# Patient Record
Sex: Female | Born: 1965 | Race: White | Hispanic: No | Marital: Single | Smoking: Never smoker
Health system: Southern US, Community
[De-identification: ages and names within clinical notes are randomized; demographics above are authoritative.]

## PROBLEM LIST (undated history)

## (undated) DIAGNOSIS — K219 Gastro-esophageal reflux disease without esophagitis: Secondary | ICD-10-CM

## (undated) DIAGNOSIS — F32A Depression, unspecified: Secondary | ICD-10-CM

## (undated) DIAGNOSIS — D649 Anemia, unspecified: Secondary | ICD-10-CM

## (undated) DIAGNOSIS — I499 Cardiac arrhythmia, unspecified: Secondary | ICD-10-CM

## (undated) DIAGNOSIS — K649 Unspecified hemorrhoids: Secondary | ICD-10-CM

## (undated) DIAGNOSIS — Z5189 Encounter for other specified aftercare: Secondary | ICD-10-CM

## (undated) HISTORY — DX: Anemia, unspecified: D64.9

## (undated) HISTORY — DX: Depression, unspecified: F32.A

## (undated) HISTORY — DX: Unspecified hemorrhoids: K64.9

## (undated) HISTORY — DX: Gastro-esophageal reflux disease without esophagitis: K21.9

## (undated) HISTORY — DX: Encounter for other specified aftercare: Z51.89

---

## 1998-12-15 ENCOUNTER — Other Ambulatory Visit: Admission: RE | Admit: 1998-12-15 | Discharge: 1998-12-15 | Payer: Self-pay | Admitting: *Deleted

## 1999-06-26 ENCOUNTER — Encounter: Payer: Self-pay | Admitting: Obstetrics and Gynecology

## 1999-06-26 ENCOUNTER — Inpatient Hospital Stay (HOSPITAL_COMMUNITY): Admission: AD | Admit: 1999-06-26 | Discharge: 1999-06-26 | Payer: Self-pay | Admitting: Obstetrics and Gynecology

## 1999-10-03 ENCOUNTER — Encounter: Payer: Self-pay | Admitting: *Deleted

## 1999-10-03 ENCOUNTER — Inpatient Hospital Stay (HOSPITAL_COMMUNITY): Admission: AD | Admit: 1999-10-03 | Discharge: 1999-10-07 | Payer: Self-pay | Admitting: *Deleted

## 1999-10-19 ENCOUNTER — Encounter (HOSPITAL_COMMUNITY): Admission: RE | Admit: 1999-10-19 | Discharge: 1999-11-22 | Payer: Self-pay | Admitting: *Deleted

## 2000-06-20 ENCOUNTER — Ambulatory Visit (HOSPITAL_COMMUNITY): Admission: RE | Admit: 2000-06-20 | Discharge: 2000-06-20 | Payer: Self-pay | Admitting: Emergency Medicine

## 2000-06-20 ENCOUNTER — Emergency Department (HOSPITAL_COMMUNITY): Admission: EM | Admit: 2000-06-20 | Discharge: 2000-06-20 | Payer: Self-pay | Admitting: Emergency Medicine

## 2000-06-20 ENCOUNTER — Encounter: Payer: Self-pay | Admitting: Emergency Medicine

## 2001-04-29 ENCOUNTER — Other Ambulatory Visit: Admission: RE | Admit: 2001-04-29 | Discharge: 2001-04-29 | Payer: Self-pay | Admitting: Obstetrics and Gynecology

## 2002-06-17 ENCOUNTER — Other Ambulatory Visit: Admission: RE | Admit: 2002-06-17 | Discharge: 2002-06-17 | Payer: Self-pay | Admitting: Obstetrics and Gynecology

## 2003-05-11 ENCOUNTER — Other Ambulatory Visit: Admission: RE | Admit: 2003-05-11 | Discharge: 2003-05-11 | Payer: Self-pay | Admitting: Obstetrics and Gynecology

## 2003-11-30 ENCOUNTER — Other Ambulatory Visit: Admission: RE | Admit: 2003-11-30 | Discharge: 2003-11-30 | Payer: Self-pay | Admitting: Obstetrics and Gynecology

## 2004-08-16 ENCOUNTER — Other Ambulatory Visit: Admission: RE | Admit: 2004-08-16 | Discharge: 2004-08-16 | Payer: Self-pay | Admitting: Obstetrics and Gynecology

## 2012-02-05 ENCOUNTER — Other Ambulatory Visit: Payer: Self-pay | Admitting: Obstetrics and Gynecology

## 2012-02-05 DIAGNOSIS — D259 Leiomyoma of uterus, unspecified: Secondary | ICD-10-CM

## 2012-02-05 DIAGNOSIS — N920 Excessive and frequent menstruation with regular cycle: Secondary | ICD-10-CM

## 2012-02-07 ENCOUNTER — Ambulatory Visit
Admission: RE | Admit: 2012-02-07 | Discharge: 2012-02-07 | Disposition: A | Discharge status: Home / Self Care | Payer: Managed Care, Other (non HMO) | Source: Ambulatory Visit | Attending: Obstetrics and Gynecology | Admitting: Obstetrics and Gynecology

## 2012-02-07 VITALS — Ht 65.0 in | Wt 145.0 lb

## 2012-02-07 DIAGNOSIS — D259 Leiomyoma of uterus, unspecified: Secondary | ICD-10-CM

## 2012-02-13 ENCOUNTER — Other Ambulatory Visit: Payer: Managed Care, Other (non HMO)

## 2012-02-18 ENCOUNTER — Telehealth: Payer: Self-pay | Admitting: Emergency Medicine

## 2012-02-18 NOTE — Telephone Encounter (Signed)
ERROR

## 2012-02-21 ENCOUNTER — Other Ambulatory Visit: Payer: Managed Care, Other (non HMO)

## 2012-03-01 HISTORY — PX: UTERINE FIBROID SURGERY: SHX826

## 2012-03-05 ENCOUNTER — Ambulatory Visit
Admission: RE | Admit: 2012-03-05 | Discharge: 2012-03-05 | Disposition: A | Discharge status: Home / Self Care | Payer: Managed Care, Other (non HMO) | Source: Ambulatory Visit | Attending: Obstetrics and Gynecology | Admitting: Obstetrics and Gynecology

## 2012-03-05 DIAGNOSIS — D259 Leiomyoma of uterus, unspecified: Secondary | ICD-10-CM

## 2012-03-05 DIAGNOSIS — N920 Excessive and frequent menstruation with regular cycle: Secondary | ICD-10-CM

## 2012-03-05 MED ORDER — GADOBENATE DIMEGLUMINE 529 MG/ML IV SOLN
13.0000 mL | Freq: Once | INTRAVENOUS | Status: AC | PRN
  Administered 2012-03-05: 13 mL via INTRAVENOUS

## 2012-03-10 ENCOUNTER — Telehealth: Payer: Self-pay | Admitting: Emergency Medicine

## 2012-03-10 NOTE — Telephone Encounter (Signed)
CALLED PT TO EXPLAIN THAT THE MRI WAS GOOD FOR THE [place] AND SHE WOULD NEED AN EBX PRIOR TO PROCEDURE.  PT STATED" IM NOT INTERESTED IN PERUSING THE [place], AND I AM STILL CHECKING TO SEE WHAT MY OPTIONS ARE" I TOLD PT THAT SHE HAD NUMEROUS FIBROIDS ACCORDING TO THE MRI AND THEY WILL CONTINUE TO GROW IF SHE DOES NOT DO ANYTHING ABOUT IT.   WE WOULD BE HAPPY TO HAVE DR HOSS TO GO OVER THE IMAGES AND/OR TALK TO HER BY PHONE IF NEEDED.  SHE IS JUST GOING TO FOLLOW-UP WITH DR LOWE.   Jari Sportsman, EMT 03/10/2012 3:46 PM

## 2012-12-31 ENCOUNTER — Emergency Department (HOSPITAL_COMMUNITY)
Admission: EM | Admit: 2012-12-31 | Discharge: 2012-12-31 | Disposition: A | Discharge status: Home / Self Care | Payer: Managed Care, Other (non HMO) | Attending: Emergency Medicine | Admitting: Emergency Medicine

## 2012-12-31 ENCOUNTER — Encounter (HOSPITAL_COMMUNITY): Payer: Self-pay | Admitting: Emergency Medicine

## 2012-12-31 DIAGNOSIS — Z79899 Other long term (current) drug therapy: Secondary | ICD-10-CM | POA: Insufficient documentation

## 2012-12-31 DIAGNOSIS — R0602 Shortness of breath: Secondary | ICD-10-CM | POA: Insufficient documentation

## 2012-12-31 DIAGNOSIS — Z3202 Encounter for pregnancy test, result negative: Secondary | ICD-10-CM | POA: Insufficient documentation

## 2012-12-31 DIAGNOSIS — R000 Tachycardia, unspecified: Secondary | ICD-10-CM

## 2012-12-31 DIAGNOSIS — I498 Other specified cardiac arrhythmias: Secondary | ICD-10-CM | POA: Insufficient documentation

## 2012-12-31 HISTORY — DX: Cardiac arrhythmia, unspecified: I49.9

## 2012-12-31 LAB — BASIC METABOLIC PANEL
BUN: 15 mg/dL (ref 6–23)
CO2: 22 mEq/L (ref 19–32)
Creatinine, Ser: 0.75 mg/dL (ref 0.50–1.10)
GFR calc non Af Amer: >90 mL/min (ref >90)
Glucose, Bld: 95 mg/dL (ref 70–99)
Potassium: 4 mEq/L (ref 3.7–5.3)
Sodium: 137 mEq/L (ref 137–147)

## 2012-12-31 LAB — D-DIMER, QUANTITATIVE: D-Dimer, Quant: 0.42 ug/mL-FEU (ref 0.00–0.48)

## 2012-12-31 LAB — TSH: TSH: 1.975 u[IU]/mL (ref 0.350–4.500)

## 2012-12-31 LAB — CBC
HCT: 32.5 % — ABNORMAL LOW (ref 36.0–46.0)
Hemoglobin: 10 g/dL — ABNORMAL LOW (ref 12.0–15.0)
MCH: 24.2 pg — ABNORMAL LOW (ref 26.0–34.0)
MCHC: 30.8 g/dL (ref 30.0–36.0)
MCV: 78.5 fL (ref 78.0–100.0)
RBC: 4.14 MIL/uL (ref 3.87–5.11)

## 2012-12-31 LAB — PREGNANCY, URINE: Preg Test, Ur: NEGATIVE

## 2012-12-31 LAB — T4, FREE: Free T4: 1.11 ng/dL (ref 0.80–1.80)

## 2012-12-31 LAB — POCT I-STAT TROPONIN I

## 2012-12-31 MED ORDER — SODIUM CHLORIDE 0.9 % IV BOLUS (SEPSIS)
1000.0000 mL | Freq: Once | INTRAVENOUS | Status: AC
  Administered 2012-12-31: 1000 mL via INTRAVENOUS

## 2012-12-31 MED ORDER — LORAZEPAM 2 MG/ML IJ SOLN
0.5000 mg | Freq: Once | INTRAMUSCULAR | Status: AC
  Administered 2012-12-31: 0.5 mg via INTRAVENOUS
  Filled 2012-12-31: qty 1

## 2012-12-31 MED ORDER — METOPROLOL SUCCINATE ER 25 MG PO TB24: 25.0000 mg | ORAL_TABLET | Freq: Two times a day (BID) | ORAL | Status: DC

## 2012-12-31 MED ORDER — METOPROLOL TARTRATE 1 MG/ML IV SOLN
5.0000 mg | Freq: Once | INTRAVENOUS | Status: AC
  Administered 2012-12-31: 5 mg via INTRAVENOUS
  Filled 2012-12-31: qty 5

## 2012-12-31 NOTE — ED Notes (Signed)
Pt complains of an irregular heartrate since about 3am, she has hx of this and she was dx anemic

## 2012-12-31 NOTE — ED Provider Notes (Signed)
CSN: [id number]     Arrival date & time 12/31/12  0450 History   First MD Initiated Contact with Patient 12/31/12 [id number]     Chief Complaint  Patient presents with  . Irregular Heart Beat   (Consider location/radiation/quality/duration/timing/severity/associated sxs/prior Treatment) Patient is a 47 y.o. female presenting with palpitations. The history is provided by the patient. No language interpreter was used.  Palpitations Palpitations quality:  Regular Onset quality:  Gradual Associated symptoms: shortness of breath   Associated symptoms: no chest pain and no nausea   Associated symptoms comment:  She reports having a normal, asymptomatic day yesterday that ended around 2:30 a.m. She went to bed and felt her heart start racing. No chest pain or nausea. She got up to get some water and felt mildly short of breath. She has had similar symptoms in the past associated with profound anemia requiring transfusion.   Past Medical History  Diagnosis Date  . Irregular heart beat    History reviewed. No pertinent past surgical history. History reviewed. No pertinent family history. History  Substance Use Topics  . Smoking status: Never Smoker   . Smokeless tobacco: Not on file  . Alcohol Use: No   OB History   Grav Para Term Preterm Abortions TAB SAB Ect Mult Living                 Review of Systems  Constitutional: Negative for fever.  Respiratory: Positive for shortness of breath.   Cardiovascular: Positive for palpitations. Negative for chest pain.  Gastrointestinal: Negative for nausea and abdominal pain.  Genitourinary: Negative for dysuria, hematuria and vaginal bleeding.  Musculoskeletal: Negative for myalgias.    Allergies  Review of patient's allergies indicates no known allergies.  Home Medications   Current Outpatient Rx  Name  Route  Sig  Dispense  Refill  . IRON PO   Oral   Take 1 capsule by mouth daily.         . norethindrone-ethinyl estradiol (BALZIVA)  0.4-35 MG-MCG tablet   Oral   Take 1 tablet by mouth daily.          BP 148/78  Pulse 100  Temp(Src) 97.9 F (36.6 C) (Oral)  Resp 24  SpO2 98%  LMP 12/10/2012 Physical Exam  Constitutional: She is oriented to person, place, and time. She appears well-developed and well-nourished. No distress.  Eyes:  Mild conjunctival pallor.  Neck: Normal range of motion.  Cardiovascular: Normal rate.   No murmur heard. Pulmonary/Chest: Effort normal.  Abdominal: Soft. There is no tenderness.  Musculoskeletal: Normal range of motion.  Neurological: She is alert and oriented to person, place, and time.  Skin: Skin is warm and dry.  Psychiatric: She has a normal mood and affect.    ED Course  Procedures (including critical care time) Labs Review Labs Reviewed  CBC  BASIC METABOLIC PANEL  POCT I-STAT TROPONIN I   Imaging Review No results found.  EKG Interpretation    Date/Time:  Wednesday December 31 2012 05:04:06 EST Ventricular Rate:  109 PR Interval:  139 QRS Duration: 96 QT Interval:  326 QTC Calculation: 439 R Axis:   79 Text Interpretation:  Sinus tachycardia Nonspecific ST abnormality Abnormal ECG Confirmed by OPITZ  MD, BRIAN ([id number]) on 12/31/2012 5:07:34 AM            MDM  No diagnosis found. 1. Sinus tachycardia  Discussed with Dr. Dierdre Highman with labs pending. Plan: fluid bolus as hgb is stable and not thought to  be contributory. Continue to monitor.   Patient appears anxious on re-evaluation. She is increasingly concerned and feels "on edge" and then I get nauseous. NO vomiting.   Tachycardia persists despite IV fluid bolus, Ativan. Hgb 10.0 - doubt source of palpitations. Discussed with Dr. Fayrene Fearing and IV Lopressor ordered with 2nd liter fluid. Thyroid studies ordered for follow up evaluation. D-dimer and u-preg added. Will continue to observe.  Given Lopressor with decreased heart rate into the 90's. She reports feeling much better. Discussed with Dr.  Antoine Poche who recommended home beta blockers and office follow up. Appointment scheduled for patient on Jan. 14, 8:30 a.m. With D. Hilty.   Arnoldo Hooker, PA-C 12/31/12 1231

## 2012-12-31 NOTE — ED Notes (Signed)
Pt ambulates w/o assistance or difficulty.

## 2012-12-31 NOTE — ED Notes (Signed)
Pt c/o of heart racing since 3 am this morning. Pt. sts her heart monitor at home said her HR was 125. Pt has hx of anemia. Pt. Had an episode like this in March and her hemoglobin was 7. Pt was given blood. Pt c/o shortness of breath earlier, but is not feeling it so much now. Pt c/o generalized weakness. Pt A&Ox4. Denies chest pain, N/V.

## 2012-12-31 NOTE — Progress Notes (Signed)
   CARE MANAGEMENT ED NOTE 12/31/2012  Patient:  Julie Hanson, Julie Hanson   Account Number:  [id number]  Date Initiated:  12/31/2012  Documentation initiated by:  Edd Arbour  Subjective/Objective Assessment:   47 yr old female [employer] managed [place] without a pcp listed in EPIC Pt confirms no pcp but only an OB GYN as Dr Rana Snare [facility] ED HR 132 hgb 10.0     Subjective/Objective Assessment Detail:   c/o an irregular heart rate since about 3am, she has hx of this and she was dx anemic Had an episode like this in March and her hemoglobin was 7. Pt was given blood. Pt c/o shortness of breath earlier, but is not feeling it so much now. Pt c/o generalized weakness. Pt A&Ox4. Denies chest pain, N/V.     Action/Plan:   CM noted no pc spoke with pt and female at bedside answered questions about how to obtain pcp and CV (female stated RN/EDP recommended CV f/u)   Action/Plan Detail:   [facility] ED CM spoke with pt on how to obtain an in network pcp with insurance coverage via the customer service number or web site   Anticipated DC Date:  12/31/2012     Status Recommendation to Physician:   Result of Recommendation:    Other ED Services  Consult Working Plan    DC Planning Services  Other  PCP issues  Outpatient Services - Pt will follow up    Choice offered to / List presented to:            Status of service:  Completed, signed off  ED Comments:   ED Comments Detail:  M encouraged pt and discussed pt's responsibility to verify with pt's insurance carrier that any recommended medical provider offered by any emergency room or a hospital provider is within the carrier's network. The pt voiced understanding

## 2013-01-01 NOTE — ED Provider Notes (Signed)
Medical screening examination/treatment/procedure(s) were performed by non-physician practitioner and as supervising physician I was immediately available for consultation/collaboration.    Teressa Lower, MD 01/01/13 1001

## 2013-01-05 ENCOUNTER — Telehealth (HOSPITAL_COMMUNITY): Payer: Self-pay

## 2013-01-05 NOTE — ED Notes (Signed)
Pt call ing for Thyroid test results.  ID verified.  Pt informed of test results for both TSH and T4, free both WNL.

## 2013-01-08 ENCOUNTER — Ambulatory Visit (INDEPENDENT_AMBULATORY_CARE_PROVIDER_SITE_OTHER): Payer: Managed Care, Other (non HMO) | Admitting: Internal Medicine

## 2013-01-08 ENCOUNTER — Encounter: Payer: Self-pay | Admitting: Internal Medicine

## 2013-01-08 VITALS — BP 100/80 | HR 81 | Ht 65.0 in | Wt 143.0 lb

## 2013-01-08 DIAGNOSIS — R0609 Other forms of dyspnea: Secondary | ICD-10-CM

## 2013-01-08 DIAGNOSIS — R5383 Other fatigue: Secondary | ICD-10-CM | POA: Insufficient documentation

## 2013-01-08 DIAGNOSIS — R5381 Other malaise: Secondary | ICD-10-CM

## 2013-01-08 DIAGNOSIS — R000 Tachycardia, unspecified: Secondary | ICD-10-CM

## 2013-01-08 DIAGNOSIS — R0989 Other specified symptoms and signs involving the circulatory and respiratory systems: Secondary | ICD-10-CM

## 2013-01-08 DIAGNOSIS — I479 Paroxysmal tachycardia, unspecified: Secondary | ICD-10-CM | POA: Insufficient documentation

## 2013-01-08 NOTE — Patient Instructions (Signed)
Your physician has requested that you have an echocardiogram. Echocardiography is a painless test that uses sound waves to create images of your heart. It provides your doctor with information about the size and shape of your heart and how well your heart's chambers and valves are working. This procedure takes approximately one hour. There are no restrictions for this procedure.  Dr. Debara Pickett has ordered a MET Test.   Your physician recommends that you schedule a follow-up appointment in a few weeks, after your tests.

## 2013-01-08 NOTE — Progress Notes (Signed)
OFFICE NOTE  Chief Complaint:  Paroxysmal tachycardia, DOE, fatigue  Primary Care Physician: Luz Lex, MD  HPI:  Julie Hanson is a very pleasant 48 year old female who recently was seen in the emergency department for paroxysmal tachycardia. She describes an episode where she was walking up stairs late at night and felt increasing shortness of graft and rapid heart rate. She had to sit down and noted that her heart rate did not slow down. Several hours later she decided to present to the emergency department. On arrival she was noted to be in a sinus tachycardia. She was given IV fluids, anxiolytics and eventually low-dose beta blocker which resulted in a decrease in her heart rate. She then noted that she felt markedly better. Apparently Dr. Percival Spanish was counseled by phone who recommended starting metoprolol twice daily for her tachycardia. She was then discharged and follows up today. She reports that she has had some problems with tachycardia in the past, although this was about a year ago when she was profoundly anemic. This is secondary to dysfunctional uterine bleeding and ultimately was treated and has not recurred. She has kept a record of her heart rates on and Iphone over the past several years which didn't clearly show an elevation in her heart rate during her time of being anemic and several weeks prior to this episode her heart rate was starting to increase leading up to the event with heart rates into the 120s and 130s.  She denies any chest pain, but does feel a little more short of breath and has had fatigue, especially when her heart rate is elevated. There is a history of heart disease in her father who has high blood pressure and COPD is well, and her brother does have Duchenne's muscular dystrophy.  She denies any substance use or over-the-counter medications. She is a nonsmoker and only drinks one or 2 drinks a week.  PMHx:  Past Medical History  Diagnosis Date  .  Irregular heart beat     Past Surgical History  Procedure Laterality Date  . Uterine fibroid surgery  03/2012    Dr. Daron Offer    FAMHx:  No family history on file.  SOCHx:   reports that she has never smoked. She has never used smokeless tobacco. She reports that she drinks about 1.0 ounces of alcohol per week. Her drug history is not on file.  ALLERGIES:  No Known Allergies  ROS: A comprehensive review of systems was negative except for: Constitutional: positive for fatigue Respiratory: positive for dyspnea on exertion Cardiovascular: positive for palpitations and tachycardia  HOME MEDS: Current Outpatient Prescriptions  Medication Sig Dispense Refill  . IRON PO Take 1 capsule by mouth daily.      . metoprolol succinate (TOPROL XL) 25 MG 24 hr tablet Take 1 tablet (25 mg total) by mouth 2 (two) times daily.  60 tablet  0  . norethindrone-ethinyl estradiol (BALZIVA) 0.4-35 MG-MCG tablet Take 1 tablet by mouth daily.       No current facility-administered medications for this visit.    LABS/IMAGING: No results found for this or any previous visit (from the past 48 hour(s)). No results found.  VITALS: BP 100/80  Pulse 81  Ht 5' 5" (1.651 m)  Wt 143 lb (64.864 kg)  BMI 23.80 kg/m2  LMP 12/10/2012  EXAM: General appearance: alert and no distress, thin, well-dressed Neck: no carotid bruit and no JVD Lungs: clear to auscultation bilaterally Heart: regular rate and rhythm,  S1, S2 normal, no murmur, click, rub or gallop Abdomen: soft, non-tender; bowel sounds normal; no masses,  no organomegaly Extremities: extremities normal, atraumatic, no cyanosis or edema Pulses: 2+ and symmetric Skin: Skin color, texture, turgor normal. No rashes or lesions Neurologic: Grossly normal Psych: Normal, does not appear overly anxious  EKG: Normal sinus rhythm at 81, no evidence for preexcitation  ASSESSMENT: 1. Paroxysmal sinus tachycardia 2. Mild dyspnea on  exertion 3. Fatigue  PLAN: 1.   Julie Hanson had an unusual episode of increased heart rate which appears to be a sinus tachycardia by my review of the EKG in the emergency department. She did note an improvement in her heart rate after receiving IV Lopressor, however he did not abruptly decreased. I suspect this may be either an appropriate or inappropriate sinus tachycardia, possibly due to some underlying viral illness. She did report an unusual episode of diarrhea that occurred several days after starting her Lopressor, which may or may not be related. She has noted some increase in fatigue and shortness of breath with exertion and has limited her exercise because of this. I do think it would be reasonable to have her undergo an echocardiogram to make sure her systolic function is normal and that there are no abnormalities in chamber sizes that could be putting her at increased risk for arrhythmias. I did not hear any valvular heart murmurs today. In addition, we can further evaluate her shortness of breath with metabolic testing-I think she would be a good candidate for MET-Testing.  I recommended she continue her beta blocker and stay on it prior to her exercise stress testing. I will plan to see her back in the office in a few weeks to discuss results of the studies.  Pixie Casino, MD, [facility] Attending Cardiologist [facility]  Julie Hanson C 01/08/2013, 11:43 AM

## 2013-01-14 ENCOUNTER — Ambulatory Visit: Payer: Managed Care, Other (non HMO) | Admitting: Internal Medicine

## 2013-01-26 ENCOUNTER — Ambulatory Visit (HOSPITAL_COMMUNITY)
Admission: RE | Admit: 2013-01-26 | Discharge: 2013-01-26 | Disposition: A | Discharge status: Home / Self Care | Payer: Managed Care, Other (non HMO) | Source: Ambulatory Visit | Attending: Cardiovascular Disease | Admitting: Cardiovascular Disease

## 2013-01-26 DIAGNOSIS — R000 Tachycardia, unspecified: Secondary | ICD-10-CM | POA: Insufficient documentation

## 2013-01-26 DIAGNOSIS — R0609 Other forms of dyspnea: Secondary | ICD-10-CM | POA: Insufficient documentation

## 2013-01-26 DIAGNOSIS — R0989 Other specified symptoms and signs involving the circulatory and respiratory systems: Secondary | ICD-10-CM | POA: Insufficient documentation

## 2013-01-26 DIAGNOSIS — R0602 Shortness of breath: Secondary | ICD-10-CM

## 2013-01-26 NOTE — Progress Notes (Signed)
2D Echo Performed 01/26/2013    Julie Hanson, RCS

## 2013-01-29 ENCOUNTER — Encounter: Payer: Self-pay | Admitting: Internal Medicine

## 2013-01-29 ENCOUNTER — Ambulatory Visit (INDEPENDENT_AMBULATORY_CARE_PROVIDER_SITE_OTHER): Payer: Managed Care, Other (non HMO) | Admitting: Internal Medicine

## 2013-01-29 VITALS — BP 128/70 | HR 92 | Ht 67.0 in | Wt 146.4 lb

## 2013-01-29 DIAGNOSIS — R0989 Other specified symptoms and signs involving the circulatory and respiratory systems: Secondary | ICD-10-CM

## 2013-01-29 DIAGNOSIS — R5381 Other malaise: Secondary | ICD-10-CM

## 2013-01-29 DIAGNOSIS — R5383 Other fatigue: Secondary | ICD-10-CM

## 2013-01-29 DIAGNOSIS — I479 Paroxysmal tachycardia, unspecified: Secondary | ICD-10-CM

## 2013-01-29 DIAGNOSIS — R0609 Other forms of dyspnea: Secondary | ICD-10-CM

## 2013-01-29 MED ORDER — PROPRANOLOL HCL ER 60 MG PO CP24: 60.0000 mg | ORAL_CAPSULE | Freq: Every day | ORAL | Status: DC

## 2013-01-29 NOTE — Progress Notes (Signed)
OFFICE NOTE  Chief Complaint:  Paroxysmal tachycardia, DOE, fatigue  Primary Care Physician: Luz Lex, MD  HPI:  Julie Hanson is a very pleasant 48 year old female who recently was seen in the emergency department for paroxysmal tachycardia. She describes an episode where she was walking up stairs late at night and felt increasing shortness of graft and rapid heart rate. She had to sit down and noted that her heart rate did not slow down. Several hours later she decided to present to the emergency department. On arrival she was noted to be in a sinus tachycardia. She was given IV fluids, anxiolytics and eventually low-dose beta blocker which resulted in a decrease in her heart rate. She then noted that she felt markedly better. Apparently Dr. Percival Spanish was counseled by phone who recommended starting metoprolol twice daily for her tachycardia. She was then discharged and follows up today. She reports that she has had some problems with tachycardia in the past, although this was about a year ago when she was profoundly anemic. This is secondary to dysfunctional uterine bleeding and ultimately was treated and has not recurred. She has kept a record of her heart rates on and Iphone over the past several years which didn't clearly show an elevation in her heart rate during her time of being anemic and several weeks prior to this episode her heart rate was starting to increase leading up to the event with heart rates into the 120s and 130s.  She denies any chest pain, but does feel a little more short of breath and has had fatigue, especially when her heart rate is elevated. There is a history of heart disease in her father who has high blood pressure and COPD is well, and her brother does have Duchenne's muscular dystrophy.  She denies any substance use or over-the-counter medications. She is a nonsmoker and only drinks one or 2 drinks a week.  [name] returns today for followup of her metabolic  testing. She underwent CPET testing on 01/26/2013. Peak R. ER was 1.25. Peak the VO2 was 81% predicted. Peak heart rate 85% predicted. The VO2 and heart rate first heart essentially parallel with a slight flattening of the VO2 curve at anaerobic threshold. She did have high level exercise completing 14 minutes. The study was interpreted as low risk and was performed on beta blocker. She feels that the metoprolol is not necessarily helping her. Although I did mention that she would need to take that medicine to try to keep her from having paroxysmal tachycardia arrhythmias in the future. She also underwent an echocardiogram which was essentially normal, with an EF of 60-65% and no wall motion abnormalities. Diastolic function was normal.   PMHx:  Past Medical History  Diagnosis Date  . Irregular heart beat     Past Surgical History  Procedure Laterality Date  . Uterine fibroid surgery  03/2012    Dr. Daron Offer    FAMHx:  No family history on file.  SOCHx:   reports that she has never smoked. She has never used smokeless tobacco. She reports that she drinks about 1.0 ounces of alcohol per week. Her drug history is not on file.  ALLERGIES:  No Known Allergies  ROS: A comprehensive review of systems was negative except for: Constitutional: positive for fatigue Respiratory: positive for dyspnea on exertion Cardiovascular: positive for palpitations and tachycardia  HOME MEDS: Current Outpatient Prescriptions  Medication Sig Dispense Refill  . IRON PO Take 1 capsule by mouth  daily.      . norethindrone-ethinyl estradiol (BALZIVA) 0.4-35 MG-MCG tablet Take 1 tablet by mouth daily.      . propranolol ER (INDERAL LA) 60 MG 24 hr capsule Take 1 capsule (60 mg total) by mouth daily.  30 capsule  11   No current facility-administered medications for this visit.    LABS/IMAGING: No results found for this or any previous visit (from the past 48 hour(s)). No results found.  VITALS: BP 128/70   Pulse 92  Ht 5\' 7"  (1.702 m)  Wt 146 lb 6.4 oz (66.407 kg)  BMI 22.92 kg/m2  LMP 12/10/2012  EXAM: General appearance: alert and no distress, thin, well-dressed Neck: no carotid bruit and no JVD Lungs: clear to auscultation bilaterally Heart: regular rate and rhythm, S1, S2 normal, no murmur, click, rub or gallop Abdomen: soft, non-tender; bowel sounds normal; no masses,  no organomegaly Extremities: extremities normal, atraumatic, no cyanosis or edema Pulses: 2+ and symmetric Skin: Skin color, texture, turgor normal. No rashes or lesions Neurologic: Grossly normal Psych: Normal, does not appear overly anxious  EKG: Normal sinus rhythm at 81, no evidence for preexcitation  ASSESSMENT: 1. Paroxysmal sinus tachycardia 2. Mild dyspnea on exertion 3. Fatigue - low risk CPET test  PLAN: 1.   Ms. Drolet had a low risk CPET test with a reduced VO2 of 81%, but did have high level exercise. This suggest possible small vessel ischemia or an element of cardiac deconditioning. Heart rate may be playing a role in this as even on beta blocker, her heart rate was in the 90s at rest. She does not feel that the Toprol is that helpful for her. There is also significant anxiety and stress. I think she may benefit from an alternative beta blocker with some alpha properties. I recommended starting propranolol XR 60 mg daily. This will take the place of her metoprolol.  I also encourage you to work on exercise and stress management. I don't feel any additional testing is necessary at this time and will plan to see her back in 6 months or sooner as necessary.  Pixie Casino, MD, [facility] Attending Cardiologist [facility]  Kester Stimpson C 01/29/2013, 4:33 PM

## 2013-01-29 NOTE — Patient Instructions (Signed)
Your physician has recommended you make the following change in your medication: STOP Toprol. START Propranolol 60mg  once daily.  Your physician wants you to follow-up in: 6 months. You will receive a reminder letter in the mail two months in advance. If you don't receive a letter, please call our office to schedule the follow-up appointment.

## 2013-03-30 ENCOUNTER — Emergency Department (HOSPITAL_COMMUNITY)
Admission: EM | Admit: 2013-03-30 | Discharge: 2013-03-30 | Disposition: A | Discharge status: Home / Self Care | Payer: Managed Care, Other (non HMO) | Attending: Emergency Medicine | Admitting: Emergency Medicine

## 2013-03-30 ENCOUNTER — Emergency Department (HOSPITAL_COMMUNITY): Payer: Managed Care, Other (non HMO)

## 2013-03-30 ENCOUNTER — Encounter (HOSPITAL_COMMUNITY): Payer: Self-pay | Admitting: Emergency Medicine

## 2013-03-30 DIAGNOSIS — I499 Cardiac arrhythmia, unspecified: Secondary | ICD-10-CM | POA: Insufficient documentation

## 2013-03-30 DIAGNOSIS — R002 Palpitations: Secondary | ICD-10-CM | POA: Insufficient documentation

## 2013-03-30 DIAGNOSIS — Z79899 Other long term (current) drug therapy: Secondary | ICD-10-CM | POA: Insufficient documentation

## 2013-03-30 LAB — CBC
HCT: 34.6 % — ABNORMAL LOW (ref 36.0–46.0)
Hemoglobin: 11 g/dL — ABNORMAL LOW (ref 12.0–15.0)
MCH: 24.8 pg — AB (ref 26.0–34.0)
MCHC: 31.8 g/dL (ref 30.0–36.0)
MCV: 77.9 fL — AB (ref 78.0–100.0)
Platelets: 209 10*3/uL (ref 150–400)
RBC: 4.44 MIL/uL (ref 3.87–5.11)
RDW: 15.7 % — ABNORMAL HIGH (ref 11.5–15.5)
WBC: 6.3 10*3/uL (ref 4.0–10.5)

## 2013-03-30 LAB — BASIC METABOLIC PANEL
BUN: 19 mg/dL (ref 6–23)
CO2: 21 meq/L (ref 19–32)
CREATININE: 0.81 mg/dL (ref 0.50–1.10)
Calcium: 8.9 mg/dL (ref 8.4–10.5)
Chloride: 103 mEq/L (ref 96–112)
GFR calc Af Amer: >90 mL/min (ref >90)
GFR calc non Af Amer: 86 mL/min — ABNORMAL LOW (ref >90)
Glucose, Bld: 112 mg/dL — ABNORMAL HIGH (ref 70–99)
Potassium: 4.2 mEq/L (ref 3.7–5.3)
Sodium: 140 mEq/L (ref 137–147)

## 2013-03-30 LAB — TROPONIN I

## 2013-03-30 NOTE — Discharge Instructions (Signed)
Your testing has not shown a reason for your symptoms - please follow up with your family doctor and your cardiologist - if you continue to have symptoms of racing heart rate or chest pain, you should return to the ER for reevaluation immediately.  Please call your doctor for a followup appointment within 24-48 hours. When you talk to your doctor please let them know that you were seen in the emergency department and have them acquire all of your records so that they can discuss the findings with you and formulate a treatment plan to fully care for your new and ongoing problems.

## 2013-03-30 NOTE — ED Provider Notes (Signed)
CSN: [id number]     Arrival date & time 03/30/13  0403 History   First MD Initiated Contact with Patient 03/30/13 [phone]     Chief Complaint  Patient presents with  . Chest Pain     (Consider location/radiation/quality/duration/timing/severity/associated sxs/prior Treatment) HPI Comments: 48 year old female with a history of paroxysmal sinus tachycardia, history of anemia requiring blood transfusion in the past 6 months. She also has a history of self expressed anxiety. She describes an episode this evening of feeling like her heart rate was racing, she also felt chills and diaphoresis. These symptoms started spontaneously, at rest, while in bed. This is all essentially resolved at this time. Over the last week she has had a brief episode of chest pain, a brief episode of back pain. She is unable to explain these symptoms. At this time she has no chest pain or shortness of breath. Review of the medical records shows that she was recently seen by Dr. Imagene Riches in clinic and found to have ongoing problems with paroxysmal tachycardia for which he changed metoprolol 2 propanolol.  She denies fevers chills nausea vomiting diarrhea dysuria swelling or rashes  Patient is a 48 y.o. female presenting with chest pain. The history is provided by the patient and medical records.  Chest Pain   Past Medical History  Diagnosis Date  . Irregular heart beat    Past Surgical History  Procedure Laterality Date  . Uterine fibroid surgery  03/2012    Dr. Daron Offer   No family history on file. History  Substance Use Topics  . Smoking status: Never Smoker   . Smokeless tobacco: Never Used  . Alcohol Use: 1 - 1.5 oz/week    2-3 drink(s) per week   OB History   Grav Para Term Preterm Abortions TAB SAB Ect Mult Living                 Review of Systems  Cardiovascular: Positive for chest pain.  All other systems reviewed and are negative.      Allergies  Review of patient's allergies indicates no known  allergies.  Home Medications   Current Outpatient Rx  Name  Route  Sig  Dispense  Refill  . IRON PO   Oral   Take 1 capsule by mouth daily.         . norethindrone-ethinyl estradiol (BALZIVA) 0.4-35 MG-MCG tablet   Oral   Take 1 tablet by mouth daily.         . propranolol ER (INDERAL LA) 60 MG 24 hr capsule   Oral   Take 1 capsule (60 mg total) by mouth daily.   30 capsule   11    BP 107/76  Pulse 88  Temp(Src) 97.5 F (36.4 C) (Oral)  Resp 20  SpO2 100%  LMP 03/20/2013 Physical Exam  Nursing note and vitals reviewed. Constitutional: She appears well-developed and well-nourished. No distress.  HENT:  Head: Normocephalic and atraumatic.  Mouth/Throat: Oropharynx is clear and moist. No oropharyngeal exudate.  Eyes: Conjunctivae and EOM are normal. Pupils are equal, round, and reactive to light. Right eye exhibits no discharge. Left eye exhibits no discharge. No scleral icterus.  Neck: Normal range of motion. Neck supple. No JVD present. No thyromegaly present.  Cardiovascular: Normal rate, regular rhythm, normal heart sounds and intact distal pulses.  Exam reveals no gallop and no friction rub.   No murmur heard. Pulmonary/Chest: Effort normal and breath sounds normal. No respiratory distress. She has no wheezes. She  has no rales.  Abdominal: Soft. Bowel sounds are normal. She exhibits no distension and no mass. There is no tenderness.  Musculoskeletal: Normal range of motion. She exhibits no edema and no tenderness.  Lymphadenopathy:    She has no cervical adenopathy.  Neurological: She is alert. Coordination normal.  Skin: Skin is warm and dry. No rash noted. No erythema.  Psychiatric: She has a normal mood and affect. Her behavior is normal.    ED Course  Procedures (including critical care time) Labs Review Labs Reviewed  CBC - Abnormal; Notable for the following:    Hemoglobin 11.0 (*)    HCT 34.6 (*)    MCV 77.9 (*)    MCH 24.8 (*)    RDW 15.7 (*)     All other components within normal limits  BASIC METABOLIC PANEL - Abnormal; Notable for the following:    Glucose, Bld 112 (*)    GFR calc non Af Amer 86 (*)    All other components within normal limits  TROPONIN I   Imaging Review Dg Chest Port 1 View  03/30/2013   CLINICAL DATA:  Nausea, tachycardia, shortness of breath.  EXAM: PORTABLE CHEST - 1 VIEW  COMPARISON:  None available for comparison at time of study interpretation.  FINDINGS: The heart size and mediastinal contours are within normal limits. Both lungs are clear. The visualized skeletal structures are unremarkable.  IMPRESSION: No active disease.   Electronically Signed   By: Elon Alas   On: 03/30/2013 05:05     EKG Interpretation   Date/Time:  Monday March 30 2013 04:07:01 EDT Ventricular Rate:  88 PR Interval:  136 QRS Duration: 80 QT Interval:  348 QTC Calculation: 421 R Axis:   56 Text Interpretation:  Normal sinus rhythm Low voltage QRS ST \\T \ T wave  abnormality, consider lateral ischemia Abnormal ECG Since last tracing  rate slower Confirmed by Eevee Borbon  MD, Leslyn Monda ([id number]) on 03/30/2013 4:40:51 AM      MDM   Final diagnoses:  Palpitations    The patient appears well, she has normal heart and lung sounds, her EKG is unremarkable. Her recent stress test did not show any signs of significant coronary disease that would require intervention according to the cardiology reports however they were unsure exactly how her heart rate and stress test correlated with each other. At this time she has a normal troponin, EKG is essentially unchanged, she is not tachycardic. The patient appears overall stable. Will obtain a chest x-ray as well given her bizarre and vague symptoms  The patient's symptoms have totally gone away, labs are unremarkable and chest x-ray is unremarkable as well. Her hemoglobin continues to improve.  The patient was informed of these results and is stable for discharge to followup in the  outpatient setting. She is agreeable and expresses her understanding to the indications for return.    Johnna Acosta, MD 03/30/13 [phone]

## 2013-03-30 NOTE — ED Notes (Signed)
Wake up with hot and cold sweat, heart bburn/refulx and heart start racing, for the since early last week having tightness on her chest, right arm pain, lower left back pain at night. Some nauseated but never vomited, sated chronic SOB related to her hx anemia. Denies any chest pain now.stated slightly headache, no blurred vision.

## 2013-03-30 NOTE — ED Notes (Signed)
The pt woke up  approx one hour ago with chest tightness sob  Nausea.  Very jittery tachycardia tonight after she started thinking about the pain

## 2014-01-07 ENCOUNTER — Encounter: Payer: Self-pay | Admitting: Internal Medicine

## 2014-01-07 ENCOUNTER — Ambulatory Visit (INDEPENDENT_AMBULATORY_CARE_PROVIDER_SITE_OTHER): Payer: Federal, State, Local not specified - PPO | Admitting: Internal Medicine

## 2014-01-07 VITALS — BP 104/70 | HR 86 | Ht 66.0 in | Wt 149.6 lb

## 2014-01-07 DIAGNOSIS — R0609 Other forms of dyspnea: Secondary | ICD-10-CM

## 2014-01-07 DIAGNOSIS — I479 Paroxysmal tachycardia, unspecified: Secondary | ICD-10-CM

## 2014-01-07 DIAGNOSIS — R531 Weakness: Secondary | ICD-10-CM

## 2014-01-07 DIAGNOSIS — R5383 Other fatigue: Secondary | ICD-10-CM

## 2014-01-07 DIAGNOSIS — M79606 Pain in leg, unspecified: Secondary | ICD-10-CM

## 2014-01-07 MED ORDER — PROPRANOLOL HCL ER 60 MG PO CP24: 60.0000 mg | ORAL_CAPSULE | Freq: Every day | ORAL | Status: DC

## 2014-01-07 NOTE — Progress Notes (Signed)
OFFICE NOTE  Chief Complaint:  Leg pain with walking up hills  Primary Care Physician: Luz Lex, MD  HPI:  Julie Hanson is a very pleasant 49 year old female who recently was seen in the emergency department for paroxysmal tachycardia. She describes an episode where she was walking up stairs late at night and felt increasing shortness of graft and rapid heart rate. She had to sit down and noted that her heart rate did not slow down. Several hours later she decided to present to the emergency department. On arrival she was noted to be in a sinus tachycardia. She was given IV fluids, anxiolytics and eventually low-dose beta blocker which resulted in a decrease in her heart rate. She then noted that she felt markedly better. Apparently Dr. Percival Spanish was counseled by phone who recommended starting metoprolol twice daily for her tachycardia. She was then discharged and follows up today. She reports that she has had some problems with tachycardia in the past, although this was about a year ago when she was profoundly anemic. This is secondary to dysfunctional uterine bleeding and ultimately was treated and has not recurred. She has kept a record of her heart rates on and Iphone over the past several years which didn't clearly show an elevation in her heart rate during her time of being anemic and several weeks prior to this episode her heart rate was starting to increase leading up to the event with heart rates into the 120s and 130s.  She denies any chest pain, but does feel a little more short of breath and has had fatigue, especially when her heart rate is elevated. There is a history of heart disease in her father who has high blood pressure and COPD is well, and her brother does have Duchenne's muscular dystrophy.  She denies any substance use or over-the-counter medications. She is a nonsmoker and only drinks one or 2 drinks a week.  Kayleanna returns today for followup of her metabolic  testing. She underwent CPET testing on 01/26/2013. Peak R. ER was 1.25. Peak the VO2 was 81% predicted. Peak heart rate 85% predicted. The VO2 and heart rate first heart essentially parallel with a slight flattening of the VO2 curve at anaerobic threshold. She did have high level exercise completing 14 minutes. The study was interpreted as low risk and was performed on beta blocker. She feels that the metoprolol is not necessarily helping her. Although I did mention that she would need to take that medicine to try to keep her from having paroxysmal tachycardia arrhythmias in the future. She also underwent an echocardiogram which was essentially normal, with an EF of 60-65% and no wall motion abnormalities. Diastolic function was normal.  I saw Ms. Brener back in the office today. Her heart rate is much better controlled on propranolol. She is able to do exercise without any worsening shortness of breath. As mentioned above her CPAP indicates a normal VO2. She is continuing to have problems with bilateral calf and leg pain with exercise. Particular he would walking up hills or doing activities like going up stairs. She does not seem to have problems with a straight activities. She also notes that there is lobe or not along her spine in the lumbar region. She denies any low back pain.  PMHx:  Past Medical History  Diagnosis Date  . Irregular heart beat     Past Surgical History  Procedure Laterality Date  . Uterine fibroid surgery  03/2012  Dr. Keturah Barre. [place]    FAMHx:  No family history on file.  SOCHx:   reports that she has never smoked. She has never used smokeless tobacco. She reports that she drinks about 1.0 - 1.5 oz of alcohol per week. Her drug history is not on file.  ALLERGIES:  No Known Allergies  ROS: A comprehensive review of systems was negative except for: Musculoskeletal: positive for myalgias  HOME MEDS: Current Outpatient Prescriptions  Medication Sig Dispense Refill  .  IRON PO Take 1 capsule by mouth. Takes occasionally    . norethindrone-ethinyl estradiol (BALZIVA) 0.4-35 MG-MCG tablet Take 1 tablet by mouth daily.    . propranolol ER (INDERAL LA) 60 MG 24 hr capsule Take 1 capsule (60 mg total) by mouth daily. 30 capsule 11   No current facility-administered medications for this visit.    LABS/IMAGING: No results found for this or any previous visit (from the past 48 hour(s)). No results found.  VITALS: BP 104/70 mmHg  Pulse 86  Ht 5\' 6"  (1.676 m)  Wt 149 lb 9.6 oz (67.858 kg)  BMI 24.16 kg/m2  EXAM: General appearance: alert and no distress, thin, well-dressed Neck: no carotid bruit and no JVD Lungs: clear to auscultation bilaterally Heart: regular rate and rhythm, S1, S2 normal, no murmur, click, rub or gallop Abdomen: soft, non-tender; bowel sounds normal; no masses,  no organomegaly Extremities: extremities normal, atraumatic, no cyanosis or edema Pulses: 2+ and symmetric Skin: Skin color, texture, turgor normal. No rashes or lesions Neurologic: Grossly normal Psych: Normal, does not appear overly anxious  EKG: Normal sinus rhythm at 86  ASSESSMENT: 1. Paroxysmal sinus tachycardia - controlled on propranolol 2. Mild dyspnea on exertion 3. Fatigue - low risk CPET test, adequate VO2 4. Bilateral leg muscle pain with exercise  PLAN: 1.   Ms. Molner had a low risk CPET test with a mildly reduced VO2 of 81%, but did have high level exercise. This suggest possible small vessel ischemia or an element of cardiac deconditioning. Heart rate may be playing a role in this as even on beta blocker, her heart rate was in the 90s at rest. She has had some improvement in these symptoms on propranolol. She continues to have bilateral leg muscle cramping and soreness which limits her activity. This seems to start in the calves and progressed to the thighs bilaterally. She pointed out a not along her spine today, which felt firm and somewhat abnormal.  She may need further imaging of this. I recommended she see an orthopedic spine specialist for further evaluation of her leg pain which could be related to low back disorder such as spinal stenosis.  Pixie Casino, MD, [facility] Attending Cardiologist [facility]  Emmely Bittinger C 01/07/2014, 2:30 PM

## 2014-01-07 NOTE — Patient Instructions (Signed)
Your physician wants you to follow-up in: 1 year. You will receive a reminder letter in the mail two months in advance. If you don't receive a letter, please call our office to schedule the follow-up appointment.  You have been referred to Dr. Hyman Bower [facility]

## 2015-01-07 ENCOUNTER — Telehealth: Payer: Self-pay | Admitting: Internal Medicine

## 2015-01-07 NOTE — Telephone Encounter (Signed)
Close encounter 

## 2015-01-10 ENCOUNTER — Ambulatory Visit: Payer: BLUE CROSS/BLUE SHIELD | Admitting: Internal Medicine

## 2015-01-11 ENCOUNTER — Ambulatory Visit: Payer: BLUE CROSS/BLUE SHIELD | Admitting: Internal Medicine

## 2015-01-12 ENCOUNTER — Encounter: Payer: Self-pay | Admitting: Internal Medicine

## 2015-01-12 ENCOUNTER — Ambulatory Visit (INDEPENDENT_AMBULATORY_CARE_PROVIDER_SITE_OTHER): Payer: BLUE CROSS/BLUE SHIELD | Admitting: Internal Medicine

## 2015-01-12 VITALS — BP 122/78 | HR 87 | Ht 66.0 in | Wt 139.7 lb

## 2015-01-12 DIAGNOSIS — I479 Paroxysmal tachycardia, unspecified: Secondary | ICD-10-CM

## 2015-01-12 DIAGNOSIS — M79606 Pain in leg, unspecified: Secondary | ICD-10-CM | POA: Diagnosis not present

## 2015-01-12 NOTE — Progress Notes (Signed)
OFFICE NOTE  Chief Complaint:  No complaints  Primary Care Physician: Luz Lex, MD  HPI:  Julie Hanson is a very pleasant 50 year old female who recently was seen in the emergency department for paroxysmal tachycardia. She describes an episode where she was walking up stairs late at night and felt increasing shortness of graft and rapid heart rate. She had to sit down and noted that her heart rate did not slow down. Several hours later she decided to present to the emergency department. On arrival she was noted to be in a sinus tachycardia. She was given IV fluids, anxiolytics and eventually low-dose beta blocker which resulted in a decrease in her heart rate. She then noted that she felt markedly better. Apparently Dr. Percival Spanish was counseled by phone who recommended starting metoprolol twice daily for her tachycardia. She was then discharged and follows up today. She reports that she has had some problems with tachycardia in the past, although this was about a year ago when she was profoundly anemic. This is secondary to dysfunctional uterine bleeding and ultimately was treated and has not recurred. She has kept a record of her heart rates on and Iphone over the past several years which didn't clearly show an elevation in her heart rate during her time of being anemic and several weeks prior to this episode her heart rate was starting to increase leading up to the event with heart rates into the 120s and 130s.  She denies any chest pain, but does feel a little more short of breath and has had fatigue, especially when her heart rate is elevated. There is a history of heart disease in her father who has high blood pressure and COPD is well, and her brother does have Duchenne's muscular dystrophy.  She denies any substance use or over-the-counter medications. She is a nonsmoker and only drinks one or 2 drinks a week.  Julie Hanson returns today for followup of her metabolic testing. She underwent  CPET testing on 01/26/2013. Peak R. ER was 1.25. Peak the VO2 was 81% predicted. Peak heart rate 85% predicted. The VO2 and heart rate first heart essentially parallel with a slight flattening of the VO2 curve at anaerobic threshold. She did have high level exercise completing 14 minutes. The study was interpreted as low risk and was performed on beta blocker. She feels that the metoprolol is not necessarily helping her. Although I did mention that she would need to take that medicine to try to keep her from having paroxysmal tachycardia arrhythmias in the future. She also underwent an echocardiogram which was essentially normal, with an EF of 60-65% and no wall motion abnormalities. Diastolic function was normal.  I saw Julie Hanson back in the office today. Her heart rate is much better controlled on propranolol. She is able to do exercise without any worsening shortness of breath. As mentioned above her CPAP indicates a normal VO2. She is continuing to have problems with bilateral calf and leg pain with exercise. Particular he would walking up hills or doing activities like going up stairs. She does not seem to have problems with a straight activities. She also notes that there is lobe or not along her spine in the lumbar region. She denies any low back pain.  Julie Hanson returns today for follow-up. She tells me that she developed worsening and progressive anemia which is likely secondary to bleeding from fibroids. At one point her H&H was as low as 7 and 21. She was  also noted to be tachycardic, not surprisingly at this time. While she was on a beta blocker this may have contributed to worsening fatigue. I had recommended discontinuing her propranolol at her last office visit she mentioned that almost immediately her symptoms improved. Subsequently she started on iron is had some improvement in her anemia and the discomfort in her legs with exercise is now gone.  PMHx:  Past Medical History  Diagnosis  Date  . Irregular heart beat     Past Surgical History  Procedure Laterality Date  . Uterine fibroid surgery  03/2012    Dr. Daron Offer    FAMHx:  No family history on file.  SOCHx:   reports that she has never smoked. She has never used smokeless tobacco. She reports that she drinks about 1.0 - 1.5 oz of alcohol per week. Her drug history is not on file.  ALLERGIES:  No Known Allergies  ROS: A comprehensive review of systems was negative.  HOME MEDS: Current Outpatient Prescriptions  Medication Sig Dispense Refill  . IRON PO Take 1 capsule by mouth. Takes occasionally    . norethindrone-ethinyl estradiol (BALZIVA) 0.4-35 MG-MCG tablet Take 1 tablet by mouth daily.     No current facility-administered medications for this visit.    LABS/IMAGING: No results found for this or any previous visit (from the past 48 hour(s)). No results found.  VITALS: BP 122/78 mmHg  Pulse 87  Ht 5\' 6"  (1.676 m)  Wt 139 lb 11.2 oz (63.368 kg)  BMI 22.56 kg/m2  EXAM: Deferred  EKG: Normal sinus rhythm at 87  ASSESSMENT: 1. Paroxysmal sinus tachycardia - resolved  PLAN: 1.   Julie Hanson has had complete resolution of her symptoms after treatment of anemia. She was further identified to have uterine fibroids as the source of her bleeding. She is now off of propranolol and based on her testing it is likely not necessary for her. She did have normal LV function with normal diastolic function on echo and a reasonable cardio metabolic test. I'm pleased that her symptoms have improved significantly and feel that she could follow-up with me only on an as-needed basis.  Pixie Casino, MD, [facility] Attending Cardiologist [place] C Elzina Devera 01/12/2015, 5:12 PM

## 2015-01-12 NOTE — Patient Instructions (Signed)
Dr Hilty recommends that you follow-up with him as needed. 

## 2015-04-01 ENCOUNTER — Emergency Department (INDEPENDENT_AMBULATORY_CARE_PROVIDER_SITE_OTHER)
Admission: EM | Admit: 2015-04-01 | Discharge: 2015-04-01 | Disposition: A | Discharge status: Home / Self Care | Payer: BLUE CROSS/BLUE SHIELD | Source: Home / Self Care | Attending: Family Medicine | Admitting: Family Medicine

## 2015-04-01 ENCOUNTER — Encounter: Payer: Self-pay | Admitting: *Deleted

## 2015-04-01 DIAGNOSIS — J069 Acute upper respiratory infection, unspecified: Secondary | ICD-10-CM

## 2015-04-01 DIAGNOSIS — J029 Acute pharyngitis, unspecified: Secondary | ICD-10-CM | POA: Diagnosis not present

## 2015-04-01 DIAGNOSIS — B9789 Other viral agents as the cause of diseases classified elsewhere: Secondary | ICD-10-CM

## 2015-04-01 LAB — POCT RAPID STREP A (OFFICE): RAPID STREP A SCREEN: NEGATIVE

## 2015-04-01 MED ORDER — BENZONATATE 200 MG PO CAPS: 200.0000 mg | ORAL_CAPSULE | Freq: Every day | ORAL | Status: DC

## 2015-04-01 NOTE — ED Provider Notes (Signed)
CSN: [id number]     Arrival date & time 04/01/15  1933 History   None    Chief Complaint  Patient presents with  . Sore Throat      HPI Comments: Patient complains of three day history of typical cold-like symptoms developing over several days,  including mild sore throat, sinus congestion, fatigue, and cough.   The history is provided by the patient.    Past Medical History  Diagnosis Date  . Irregular heart beat    Past Surgical History  Procedure Laterality Date  . Uterine fibroid surgery  03/2012    Dr. Daron Offer   History reviewed. No pertinent family history. Social History  Substance Use Topics  . Smoking status: Never Smoker   . Smokeless tobacco: Never Used  . Alcohol Use: 1.0 - 1.5 oz/week    2-3 drink(s) per week   OB History    No data available     Review of Systems + sore throat + hoarse + cough No pleuritic pain No wheezing ? nasal congestion + post-nasal drainage No sinus pain/pressure No itchy/red eyes No earache No hemoptysis No SOB No fever/chills No nausea No vomiting No abdominal pain No diarrhea No urinary symptoms No skin rash + fatigue No myalgias No headache Used OTC meds without relief  Allergies  Review of patient's allergies indicates no known allergies.  Home Medications   Prior to Admission medications   Medication Sig Start Date End Date Taking? Authorizing Provider  benzonatate (TESSALON) 200 MG capsule Take 1 capsule (200 mg total) by mouth at bedtime. Take as needed for cough 04/01/15   Kandra Nicolas, MD  IRON PO Take 1 capsule by mouth. Takes occasionally    Historical Provider, MD  norethindrone-ethinyl estradiol (BALZIVA) 0.4-35 MG-MCG tablet Take 1 tablet by mouth daily.    Historical Provider, MD   Meds Ordered and Administered this Visit  Medications - No data to display  BP 125/82 mmHg  Pulse 95  Temp(Src) 98.2 F (36.8 C) (Oral)  Wt 140 lb (63.504 kg)  SpO2 99%  LMP 03/16/2015 No data  found.   Physical Exam Nursing notes and Vital Signs reviewed. Appearance:  Patient appears stated age, and in no acute distress Eyes:  Pupils are equal, round, and reactive to light and accomodation.  Extraocular movement is intact.  Conjunctivae are not inflamed  Ears:  Canals normal.  Tympanic membranes normal.  Nose:  Mildly congested turbinates.  No sinus tenderness.   Pharynx:  Uvula slightly erythematous Neck:  Supple.  Tender enlarged posterior/lateral nodes are palpated bilaterally.  Tonsillar nodes enlarged  Lungs:  Clear to auscultation.  Breath sounds are equal.  Moving air well. Heart:  Regular rate and rhythm without murmurs, rubs, or gallops.  Abdomen:  Nontender without masses or hepatosplenomegaly.  Bowel sounds are present.  No CVA or flank tenderness.  Extremities:  No edema.  Skin:  No rash present.   ED Course  Procedures none    Labs Reviewed  STREP A DNA PROBE  POCT RAPID STREP A (OFFICE) negative      MDM   1. Acute pharyngitis, unspecified etiology   2. Viral URI with cough    Throat culture pending. There is no evidence of bacterial infection today.  Treat symptomatically for now  Prescription written for Benzonatate (Tessalon) to take at bedtime for night-time cough.  Take plain guaifenesin (1200mg  extended release tabs such as Mucinex) twice daily, with plenty of water, for cough and congestion.  May add Pseudoephedrine (30mg , one or two every 4 to 6 hours) for sinus congestion.  Get adequate rest.   May use Afrin nasal spray (or generic oxymetazoline) twice daily for about 5 days and then discontinue.  Also recommend using saline nasal spray several times daily and saline nasal irrigation (AYR is a common brand).   Try warm salt water gargles for sore throat.  Stop all antihistamines for now, and other non-prescription cough/cold preparations. May take Ibuprofen 200mg , 4 tabs every 8 hours with food for sore throat, headache, etc.   Follow-up with  family doctor if not improving about 7 to 10 days.    Kandra Nicolas, MD 04/05/15 [phone]

## 2015-04-01 NOTE — Discharge Instructions (Signed)
Take plain guaifenesin (1200mg  extended release tabs such as Mucinex) twice daily, with plenty of water, for cough and congestion.  May add Pseudoephedrine (30mg , one or two every 4 to 6 hours) for sinus congestion.  Get adequate rest.   May use Afrin nasal spray (or generic oxymetazoline) twice daily for about 5 days and then discontinue.  Also recommend using saline nasal spray several times daily and saline nasal irrigation (AYR is a common brand).   Try warm salt water gargles for sore throat.  Stop all antihistamines for now, and other non-prescription cough/cold preparations. May take Ibuprofen 200mg , 4 tabs every 8 hours with food for sore throat, headache, etc.   Follow-up with family doctor if not improving about 7 to 10 days.

## 2015-04-01 NOTE — ED Notes (Signed)
Pt c/o sore throat x 3 days. Taken theraflu and advil otc.

## 2015-04-02 LAB — STREP A DNA PROBE: GASP: NOT DETECTED

## 2015-04-03 ENCOUNTER — Telehealth: Payer: Self-pay | Admitting: Emergency Medicine

## 2017-07-02 ENCOUNTER — Other Ambulatory Visit: Payer: Self-pay

## 2017-07-02 ENCOUNTER — Emergency Department (HOSPITAL_COMMUNITY): Payer: BLUE CROSS/BLUE SHIELD

## 2017-07-02 ENCOUNTER — Encounter (HOSPITAL_COMMUNITY): Payer: Self-pay | Admitting: Emergency Medicine

## 2017-07-02 ENCOUNTER — Emergency Department (HOSPITAL_COMMUNITY)
Admission: EM | Admit: 2017-07-02 | Discharge: 2017-07-02 | Disposition: A | Discharge status: Home / Self Care | Payer: BLUE CROSS/BLUE SHIELD | Attending: Emergency Medicine | Admitting: Emergency Medicine

## 2017-07-02 DIAGNOSIS — S0993XA Unspecified injury of face, initial encounter: Secondary | ICD-10-CM

## 2017-07-02 DIAGNOSIS — X58XXXA Exposure to other specified factors, initial encounter: Secondary | ICD-10-CM | POA: Insufficient documentation

## 2017-07-02 DIAGNOSIS — Z79899 Other long term (current) drug therapy: Secondary | ICD-10-CM | POA: Diagnosis not present

## 2017-07-02 DIAGNOSIS — S01511A Laceration without foreign body of lip, initial encounter: Secondary | ICD-10-CM | POA: Diagnosis not present

## 2017-07-02 DIAGNOSIS — Y999 Unspecified external cause status: Secondary | ICD-10-CM | POA: Insufficient documentation

## 2017-07-02 DIAGNOSIS — Y929 Unspecified place or not applicable: Secondary | ICD-10-CM | POA: Insufficient documentation

## 2017-07-02 DIAGNOSIS — R079 Chest pain, unspecified: Secondary | ICD-10-CM | POA: Insufficient documentation

## 2017-07-02 DIAGNOSIS — R55 Syncope and collapse: Secondary | ICD-10-CM | POA: Diagnosis present

## 2017-07-02 DIAGNOSIS — Y939 Activity, unspecified: Secondary | ICD-10-CM | POA: Diagnosis not present

## 2017-07-02 LAB — CBC WITH DIFFERENTIAL/PLATELET
Abs Immature Granulocytes: 0 10*3/uL (ref 0.0–0.1)
BASOS ABS: 0 10*3/uL (ref 0.0–0.1)
Basophils Relative: 1 %
Eosinophils Absolute: 0 10*3/uL (ref 0.0–0.7)
Eosinophils Relative: 1 %
HCT: 30.7 % — ABNORMAL LOW (ref 36.0–46.0)
Hemoglobin: 9.5 g/dL — ABNORMAL LOW (ref 12.0–15.0)
Immature Granulocytes: 0 %
Lymphocytes Relative: 16 %
Lymphs Abs: 0.9 10*3/uL (ref 0.7–4.0)
MCH: 27 pg (ref 26.0–34.0)
MCHC: 30.9 g/dL (ref 30.0–36.0)
MCV: 87.2 fL (ref 78.0–100.0)
Monocytes Absolute: 0.4 10*3/uL (ref 0.1–1.0)
Monocytes Relative: 6 %
Neutro Abs: 4.1 10*3/uL (ref 1.7–7.7)
Neutrophils Relative %: 76 %
Platelets: 184 10*3/uL (ref 150–400)
RBC: 3.52 MIL/uL — AB (ref 3.87–5.11)
RDW: 12.3 % (ref 11.5–15.5)
WBC: 5.4 10*3/uL (ref 4.0–10.5)

## 2017-07-02 LAB — URINALYSIS, ROUTINE W REFLEX MICROSCOPIC

## 2017-07-02 LAB — COMPREHENSIVE METABOLIC PANEL
ALT: 10 U/L (ref 0–44)
ANION GAP: 8 (ref 5–15)
AST: 16 U/L (ref 15–41)
Albumin: 3.3 g/dL — ABNORMAL LOW (ref 3.5–5.0)
Alkaline Phosphatase: 32 U/L — ABNORMAL LOW (ref 38–126)
BILIRUBIN TOTAL: 0.4 mg/dL (ref 0.3–1.2)
BUN: 12 mg/dL (ref 6–20)
CO2: 23 mmol/L (ref 22–32)
Calcium: 8.5 mg/dL — ABNORMAL LOW (ref 8.9–10.3)
Chloride: 106 mmol/L (ref 98–111)
Creatinine, Ser: 0.83 mg/dL (ref 0.44–1.00)
GFR calc Af Amer: >60 mL/min (ref >60)
GFR calc non Af Amer: >60 mL/min (ref >60)
GLUCOSE: 104 mg/dL — AB (ref 70–99)
POTASSIUM: 4 mmol/L (ref 3.5–5.1)
SODIUM: 137 mmol/L (ref 135–145)
TOTAL PROTEIN: 5.7 g/dL — AB (ref 6.5–8.1)

## 2017-07-02 LAB — D-DIMER, QUANTITATIVE: D-Dimer, Quant: 0.51 ug/mL-FEU — ABNORMAL HIGH (ref 0.00–0.50)

## 2017-07-02 LAB — URINALYSIS, MICROSCOPIC (REFLEX)

## 2017-07-02 MED ORDER — CEPHALEXIN 500 MG PO CAPS: 500.0000 mg | ORAL_CAPSULE | Freq: Two times a day (BID) | ORAL | 0 refills | Status: DC

## 2017-07-02 MED ORDER — IOPAMIDOL (ISOVUE-370) INJECTION 76%
100.0000 mL | Freq: Once | INTRAVENOUS | Status: AC | PRN
  Administered 2017-07-02: 100 mL via INTRAVENOUS

## 2017-07-02 MED ORDER — IOPAMIDOL (ISOVUE-370) INJECTION 76%: 100.0000 mL | Freq: Once | INTRAVENOUS | Status: DC | PRN

## 2017-07-02 MED ORDER — LIDOCAINE-EPINEPHRINE-TETRACAINE (LET) SOLUTION
3.0000 mL | Freq: Once | NASAL | Status: AC
  Administered 2017-07-02: 3 mL via TOPICAL
  Filled 2017-07-02: qty 3

## 2017-07-02 MED ORDER — LIDOCAINE-EPINEPHRINE (PF) 2 %-1:200000 IJ SOLN
10.0000 mL | Freq: Once | INTRAMUSCULAR | Status: AC
  Administered 2017-07-02: 10 mL
  Filled 2017-07-02: qty 20

## 2017-07-02 MED ORDER — IOPAMIDOL (ISOVUE-370) INJECTION 76%
INTRAVENOUS | Status: AC
  Filled 2017-07-02: qty 100

## 2017-07-02 MED ORDER — TETANUS-DIPHTH-ACELL PERTUSSIS 5-2.5-18.5 LF-MCG/0.5 IM SUSP
0.5000 mL | Freq: Once | INTRAMUSCULAR | Status: AC
  Administered 2017-07-02: 0.5 mL via INTRAMUSCULAR
  Filled 2017-07-02: qty 0.5

## 2017-07-02 MED ORDER — SODIUM CHLORIDE 0.9 % IV BOLUS
1000.0000 mL | Freq: Once | INTRAVENOUS | Status: AC
  Administered 2017-07-02: 1000 mL via INTRAVENOUS

## 2017-07-02 NOTE — ED Notes (Signed)
Pt returns from xray and denies c/o

## 2017-07-02 NOTE — Discharge Instructions (Signed)
Pull on your stitches in about 5 days to see if they are ready to come out.  If not wait a few days and try again.  Make sure you are eating a soft diet and nothing that is too hot, cold or spicy.  You can take Tylenol or ibuprofen as needed for the pain.  Make sure you are drinking plenty of fluids and staying hydrated.

## 2017-07-02 NOTE — ED Notes (Signed)
Pt stats she understands instructions and home stable with steady gait. Home with family

## 2017-07-02 NOTE — ED Provider Notes (Signed)
[facility] EMERGENCY DEPARTMENT Provider Note   CSN: [id number] Arrival date & time: 07/02/17  0758     History   Chief Complaint Chief Complaint  Patient presents with  . Loss of Consciousness    HPI Julie Hanson is a 52 y.o. female.  Is a 52 year old female presenting today after a syncopal event at home.  Patient states that it was a normal morning and she felt fine.  She had gotten up and was talking with her son in the kitchen when suddenly she started feeling dizzy and states she just did not feel good.  She has had vertigo in the past and states that it felt a little bit like that.  She laid her head down on the cold marble countertop and then her son states that she passed out and fell on the floor hitting her face.  She was unconscious for a minute or less and then started talking.  She denies any cardiac complaints such as palpitations, chest pain or shortness of breath.  She had no nausea or vomiting.  Patient states since waking up she is just felt kind of generally weak and wants to move slowly.  She denies any dizziness at this time.  She does complain of a mild headache but denies any neck pain.  She was able to walk and denies feeling dizzy with walking.  EMS reports that patient's blood pressure was 118/60 as well as a heart rate in the 70s upon their arrival.  No prior history of syncope in the past.  Patient also notes that she is currently having vaginal bleeding for the last 2 to 3 days related to her fibroids.  The history is provided by the patient and a relative.  Loss of Consciousness   This is a new problem. The current episode started 1 to 2 hours ago. The problem occurs constantly. The problem has been resolved. She lost consciousness for a period of less than one minute. The problem is associated with standing up (started while standing in the kitchen). Associated symptoms include dizziness, light-headedness and vertigo. Pertinent negatives  include back pain, chest pain, congestion, fever, focal weakness, nausea, palpitations, seizures, slurred speech, visual change, vomiting and weakness. Treatments tried: rest. The treatment provided moderate relief. Her past medical history does not include CAD, DM or HTN. Past medical history comments: uterin fibriods on birth control.    Past Medical History:  Diagnosis Date  . Irregular heart beat     Patient Active Problem List   Diagnosis Date Noted  . Leg pain 01/07/2014  . Tachycardia, paroxysmal ([place]) 01/08/2013  . DOE (dyspnea on exertion) 01/08/2013  . Fatigue 01/08/2013    Past Surgical History:  Procedure Laterality Date  . UTERINE FIBROID SURGERY  03/2012   Dr. Daron Offer     OB History   None      Home Medications    Prior to Admission medications   Medication Sig Start Date End Date Taking? Authorizing Provider  benzonatate (TESSALON) 200 MG capsule Take 1 capsule (200 mg total) by mouth at bedtime. Take as needed for cough 04/01/15   Kandra Nicolas, MD  IRON PO Take 1 capsule by mouth. Takes occasionally    [provider]  norethindrone-ethinyl estradiol (BALZIVA) 0.4-35 MG-MCG tablet Take 1 tablet by mouth daily.    [provider]    Family History No family history on file.  Social History Social History   Tobacco Use  .  Smoking status: Never Smoker  . Smokeless tobacco: Never Used  Substance Use Topics  . Alcohol use: Yes    Alcohol/week: 1.2 - 1.8 oz    Types: 2 - 3 drink(s) per week  . Drug use: Not on file     Allergies   Patient has no known allergies.   Review of Systems Review of Systems  Constitutional: Negative for fever.  HENT: Negative for congestion.   Cardiovascular: Positive for syncope. Negative for chest pain and palpitations.  Gastrointestinal: Negative for nausea and vomiting.  Musculoskeletal: Negative for back pain.  Neurological: Positive for dizziness, vertigo and light-headedness. Negative for  focal weakness, seizures and weakness.  All other systems reviewed and are negative.    Physical Exam Updated Vital Signs BP 135/81 (BP Location: Left Arm)   Pulse 86   Resp 18   Ht 5\' 6"  (1.676 m)   Wt 68 kg (150 lb)   SpO2 100%   BMI 24.21 kg/m   Physical Exam  Constitutional: She is oriented to person, place, and time. She appears well-developed and well-nourished. No distress.  HENT:  Head: Normocephalic and atraumatic.  Mouth/Throat: Oropharynx is clear and moist. No trismus in the jaw. Lacerations present.    No tongue laceration  Eyes: Pupils are equal, round, and reactive to light. EOM are normal.  Neck: No spinous process tenderness and no muscular tenderness present. Normal range of motion present.  Cardiovascular: Normal rate, regular rhythm, normal heart sounds and intact distal pulses. Exam reveals no friction rub.  No murmur heard. Pulmonary/Chest: Effort normal and breath sounds normal. She has no wheezes. She has no rales.  Abdominal: Soft. Bowel sounds are normal. She exhibits no distension. There is no tenderness. There is no rebound and no guarding.  Musculoskeletal: Normal range of motion. She exhibits no tenderness.  No edema  Neurological: She is alert and oriented to person, place, and time. No cranial nerve deficit.  Skin: Skin is warm and dry. No rash noted. There is pallor.  Psychiatric: She has a normal mood and affect. Her behavior is normal.  Nursing note and vitals reviewed.    ED Treatments / Results  Labs (all labs ordered are listed, but only abnormal results are displayed) Labs Reviewed  CBC WITH DIFFERENTIAL/PLATELET - Abnormal; Notable for the following components:      Result Value   RBC 3.52 (*)    Hemoglobin 9.5 (*)    HCT 30.7 (*)    All other components within normal limits  COMPREHENSIVE METABOLIC PANEL - Abnormal; Notable for the following components:   Glucose, Bld 104 (*)    Calcium 8.5 (*)    Total Protein 5.7 (*)     Albumin 3.3 (*)    Alkaline Phosphatase 32 (*)    All other components within normal limits  D-DIMER, QUANTITATIVE (NOT AT [facility]) - Abnormal; Notable for the following components:   D-Dimer, Quant 0.51 (*)    All other components within normal limits  URINALYSIS, ROUTINE W REFLEX MICROSCOPIC  POC URINE PREG, ED    EKG EKG Interpretation  Date/Time:  Tuesday July 02 2017 08:19:14 EDT Ventricular Rate:  83 PR Interval:    QRS Duration: 89 QT Interval:  368 QTC Calculation: 433 R Axis:   56 Text Interpretation:  Sinus rhythm Low voltage, precordial leads RSR' in V1 or V2, probably normal variant Borderline T abnormalities, anterior leads No significant change since last tracing Confirmed by Blanchie Dessert [phone]) on 07/02/2017 9:13:39 AM  Radiology Dg Chest 2 View  Result Date: 07/02/2017 CLINICAL DATA:  Syncopal episode resulting in a fall from a bar stool. EXAM: CHEST - 2 VIEW COMPARISON:  Portable chest x-ray of March 30, 2013 FINDINGS: The lungs are well-expanded and clear. The heart and pulmonary vascularity are normal. The mediastinum is normal in width. There is no pleural effusion. The trachea is midline. The bony thorax exhibits no acute abnormality. IMPRESSION: There is no pneumonia nor other acute cardiopulmonary abnormality. Electronically Signed   By: David  Martinique M.D.   On: 07/02/2017 09:08   Ct Head Wo Contrast  Result Date: 07/02/2017 CLINICAL DATA:  Posttraumatic headache after fall today, dizziness. EXAM: CT HEAD WITHOUT CONTRAST TECHNIQUE: Contiguous axial images were obtained from the base of the skull through the vertex without intravenous contrast. COMPARISON:  None. FINDINGS: Brain: No evidence of acute infarction, hemorrhage, hydrocephalus, extra-axial collection or mass lesion/mass effect. Vascular: No hyperdense vessel or unexpected calcification. Skull: Normal. Negative for fracture or focal lesion. Sinuses/Orbits: No acute finding. Other: None. IMPRESSION:  Normal head CT. Electronically Signed   By: Marijo Conception, M.D.   On: 07/02/2017 10:05   Ct Angio Chest Pe W And/or Wo Contrast  Result Date: 07/02/2017 CLINICAL DATA:  Syncopal event with fall.  Elevated D-dimer. EXAM: CT ANGIOGRAPHY CHEST WITH CONTRAST TECHNIQUE: Multidetector CT imaging of the chest was performed using the standard protocol during bolus administration of intravenous contrast. Multiplanar CT image reconstructions and MIPs were obtained to evaluate the vascular anatomy. CONTRAST:  154mL ISOVUE-370 IOPAMIDOL (ISOVUE-370) INJECTION 76% COMPARISON:  Chest radiograph from earlier today. FINDINGS: Cardiovascular: The study is high quality for the evaluation of pulmonary embolism. There are no filling defects in the central, lobar, segmental or subsegmental pulmonary artery branches to suggest acute pulmonary embolism. Great vessels are normal in course and caliber. Normal heart size. No significant pericardial fluid/thickening. Mediastinum/Nodes: No discrete thyroid nodules. Unremarkable esophagus. No pathologically enlarged axillary, mediastinal or hilar lymph nodes. Lungs/Pleura: No pneumothorax. No pleural effusion. No acute consolidative airspace disease, lung masses or significant pulmonary nodules. Upper abdomen: Small hiatal hernia. 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 Musculoskeletal: Nonspecific 1 cm sclerotic lesion in left T2 vertebral body. Review of the MIP images confirms the above findings. IMPRESSION: 1. No pulmonary embolism.  No active pulmonary disease. 2. 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 Follow-up CT angiogram of the abdomen recommended in 12 months. This recommendation follows ACR consensus guidelines: White Paper of the ACR Incidental Findings Committee II on Vascular Findings. J Am Coll Radiol [phone]. 3. Nonspecific small T2 vertebral body sclerotic lesion without overtly aggressive features. Bone scan  correlation may be obtained if clinically warranted. 4. Small hiatal hernia. Electronically Signed   By: Ilona Sorrel M.D.   On: 07/02/2017 12:06    Procedures Procedures (including critical care time)  LACERATION REPAIR Performed by: [employer] Authorized by: Blanchie Dessert Consent: Verbal consent obtained. Risks and benefits: risks, benefits and alternatives were discussed Consent given by: patient Patient identity confirmed: provided demographic data Prepped and Draped in normal sterile fashion Wound explored  Laceration Location: lower lip  Laceration Length: 2cm  No Foreign Bodies seen or palpated  Anesthesia: local infiltration  Local anesthetic: lidocaine 2% with epinephrine  Anesthetic total: 2 ml  Irrigation method: syringe Amount of cleaning: standard  Skin closure: 6.0 vicryl  Number of sutures: 5 simple interrupted and 1 horizontal mattress    Patient tolerance: Patient tolerated  the procedure well with no immediate complications.   Medications Ordered in ED Medications  lidocaine-EPINEPHrine-tetracaine (LET) solution (has no administration in time range)  sodium chloride 0.9 % bolus 1,000 mL (has no administration in time range)     Initial Impression / Assessment and Plan / ED Course  I have reviewed the triage vital signs and the nursing notes.  Pertinent labs & imaging results that were available during my care of the patient were reviewed by me and considered in my medical decision making (see chart for details).     Healthy 52 year old female presenting today after a syncopal event.  Patient did have prodromal symptoms this could be vasovagal however patient is also had a increased risk for PE as she is 67 and taking birth control.  She denies any recent heat exposure, diet changes or respiratory symptoms.  She has had no infectious symptoms recently.  Patient states she is not a big drinker but denies any alcohol use and she does not  have a reason to be dehydrated. Patient will need repair of her lower lip laceration.  Tetanus shot will be updated today.  Chest x-ray, CT head due to injury, CBC, CMP, d-dimer pending.  Patient's EKG has artifact but shows a sinus rhythm without significant ST abnormality.  9:44 AM Patient's chest x-ray within normal limits, d-dimer is elevated at 0.51.  Patient is anemic with hemoglobin of 9.5 however she does admit to having fibroids and heavy bleeding.  This is not much different than her baseline.  Electrolytes and renal function within normal limits.  CT of the head and CT of the chest pending  1:02 PM CT is negative for PE.  Incidentally patient does have a splenic artery aneurysm that needs follow-up in 12 months which was communicated with the patient.  She also has a small lesion on T2 but she states she has had that for a long time and she had it checked out and it was deemed normal.  Suspect that patient's syncope today was vasovagal in nature.  Will discharge home.  Patient has follow-up with dentist later today.  Final Clinical Impressions(s) / ED Diagnoses   Final diagnoses:  Syncope and collapse  Laceration of lower lip, initial encounter  Tooth injury, initial encounter    ED Discharge Orders    None       Blanchie Dessert, MD 07/02/17 1303

## 2017-07-02 NOTE — ED Triage Notes (Signed)
Per [facility] pt coming from home. Started feeling dizzy while getting dressed for work, had syncopal episode from barstool and landed face first on granite countertop causing patient to chip left from tooth. Negative orthostatics. Patient ambulatory but appears pale. Reports heavy period.

## 2019-12-21 IMAGING — CT CT HEAD W/O CM
4 series · 16 of 47 positions shown, 18 images · non-contrast
Comparison: None.

CLINICAL DATA: Posttraumatic headache after fall today, dizziness.

EXAM:
CT HEAD WITHOUT CONTRAST
TECHNIQUE: Contiguous axial images were obtained from the base of the skull
through the vertex without intravenous contrast.

[Series 3: head wo · axial · 0.41mm/px · z∈[-45,+60]mm · 7 of 29 slices shown, 9 images]
[im 4/29  brain]
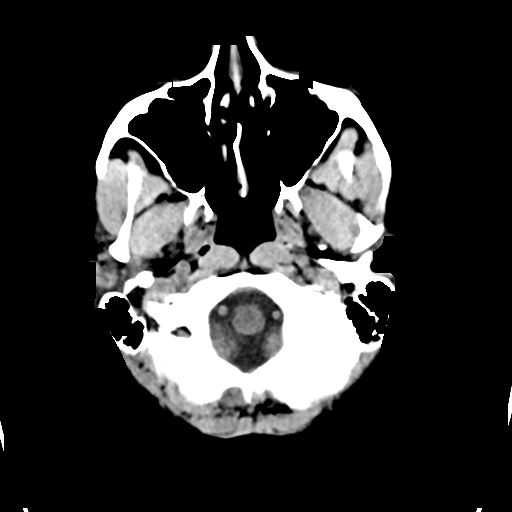
[im 4/29  bone]
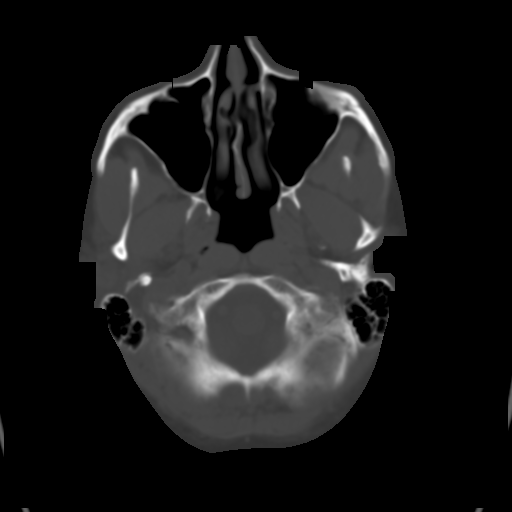
[im 8/29  brain]
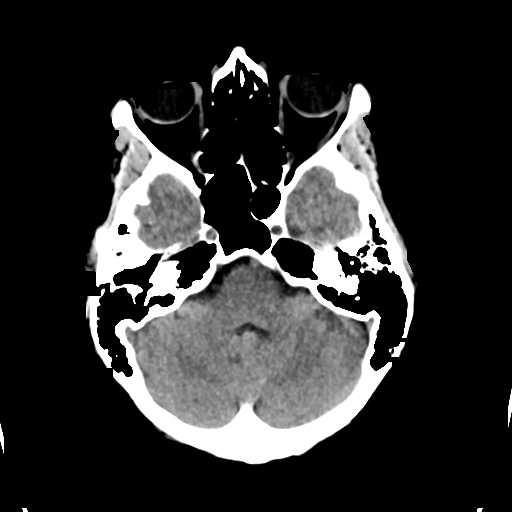
[im 11/29  brain]
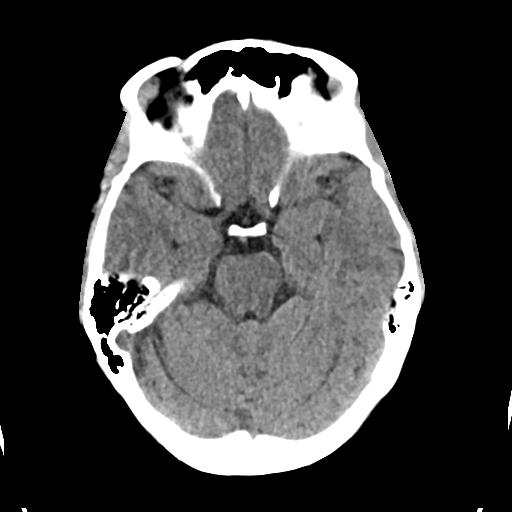
[im 15/29  brain]
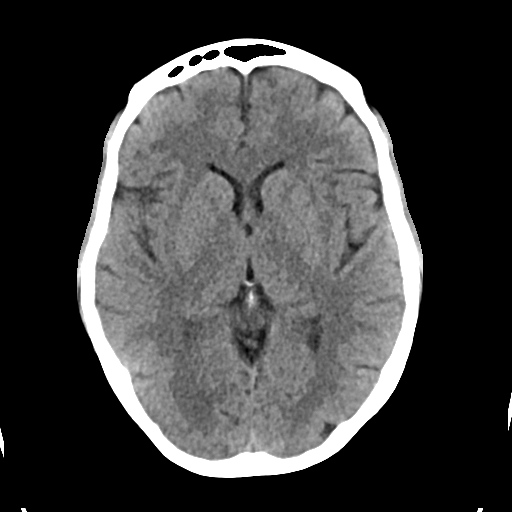
[im 18/29  brain]
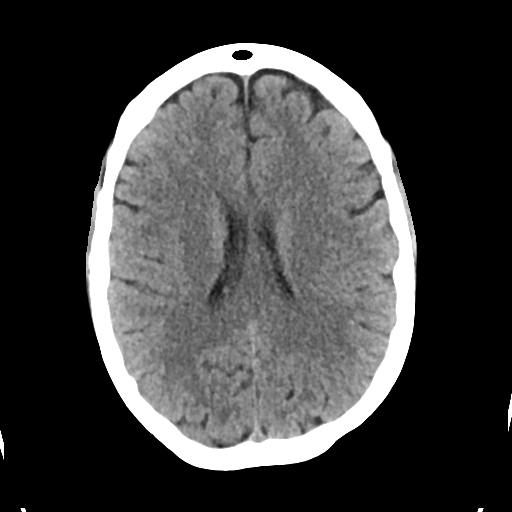
[im 18/29  bone]
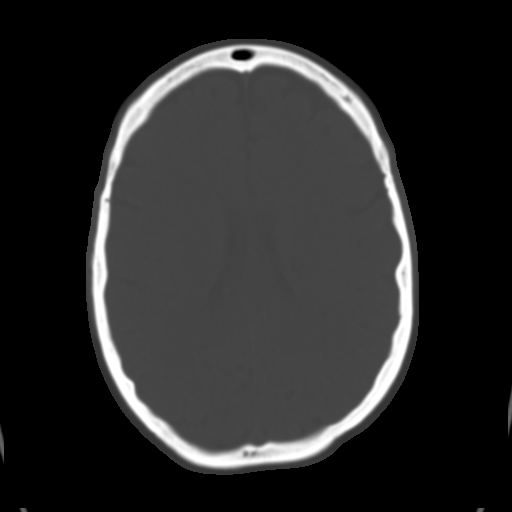
[im 22/29  brain]
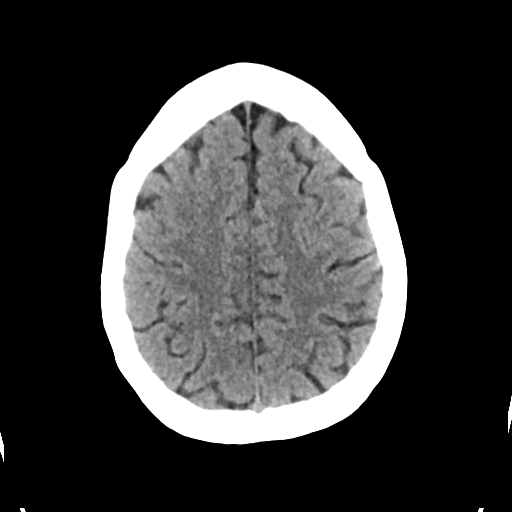
[im 25/29  brain]
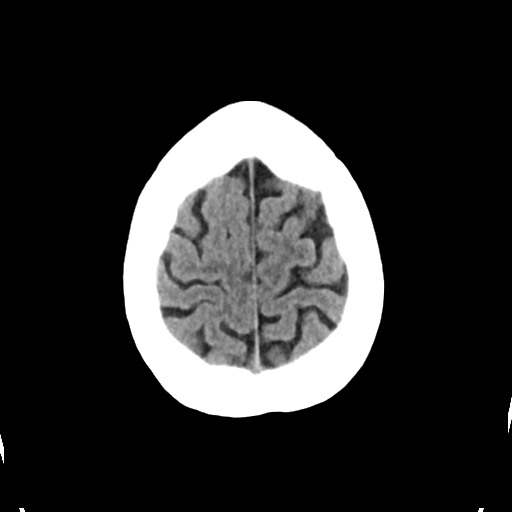

[Series 4: head bone · axial · 0.41mm/px · z∈[-46,-18]mm · 3 of 72 slices shown]
[im 8/72  bone]
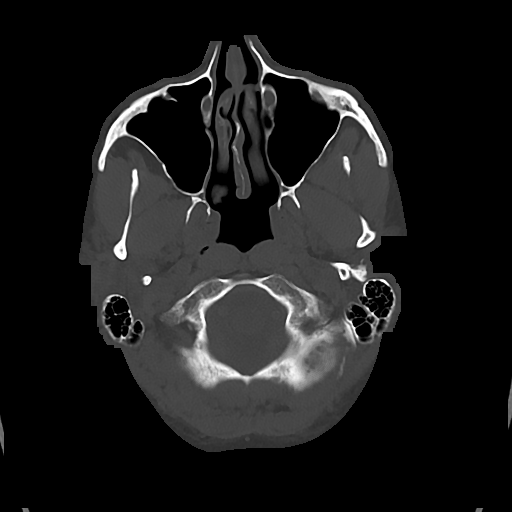
[im 15/72  bone]
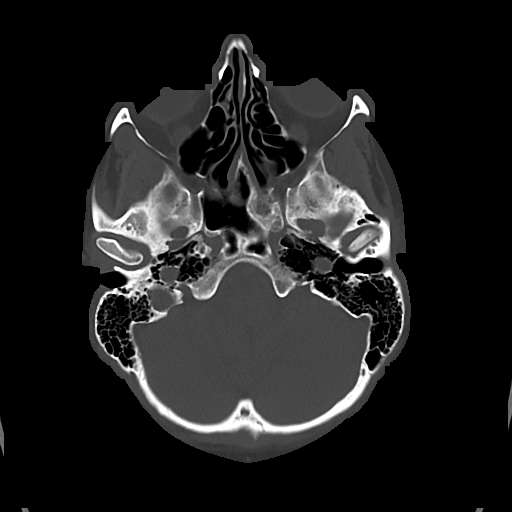
[im 22/72  bone]
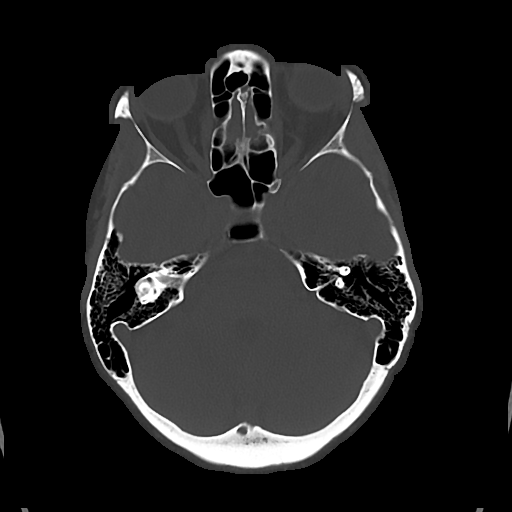

[Series 5: cor soft · coronal · 0.31mm/px · 3 of 67 slices shown]
[im 23/67  brain]
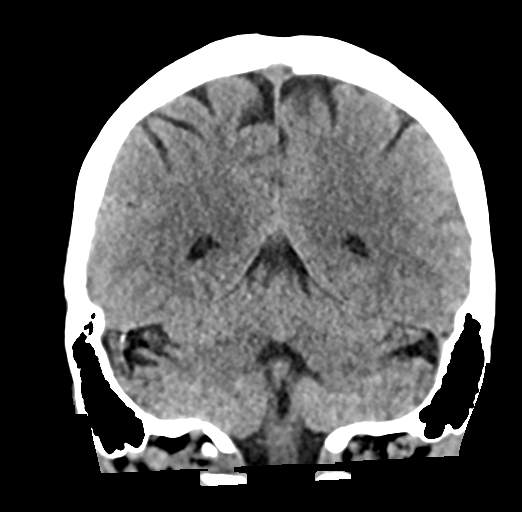
[im 30/67  brain]
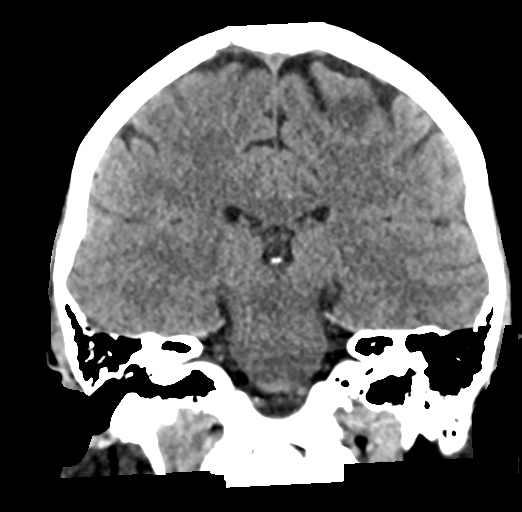
[im 37/67  brain]
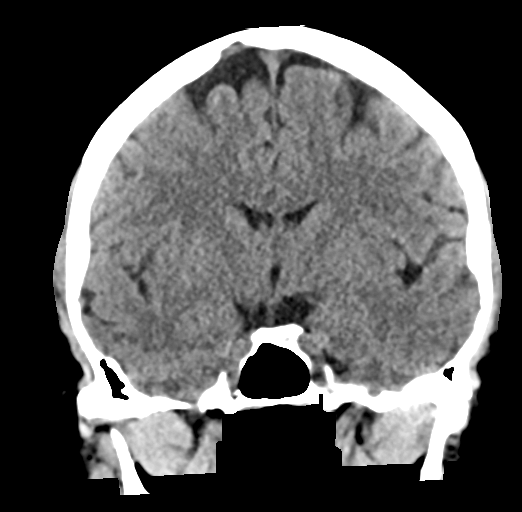

[Series 6: sag soft · sagittal · 0.31mm/px · 3 of 57 slices shown]
[im 19/57  brain]
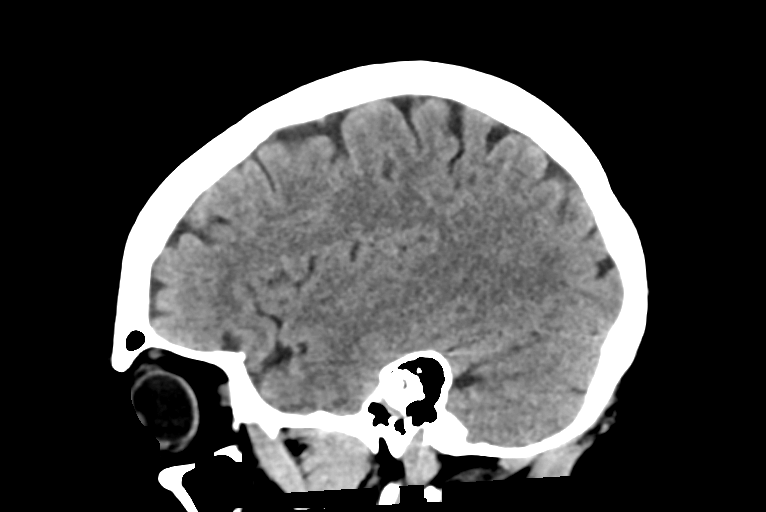
[im 29/57  brain]
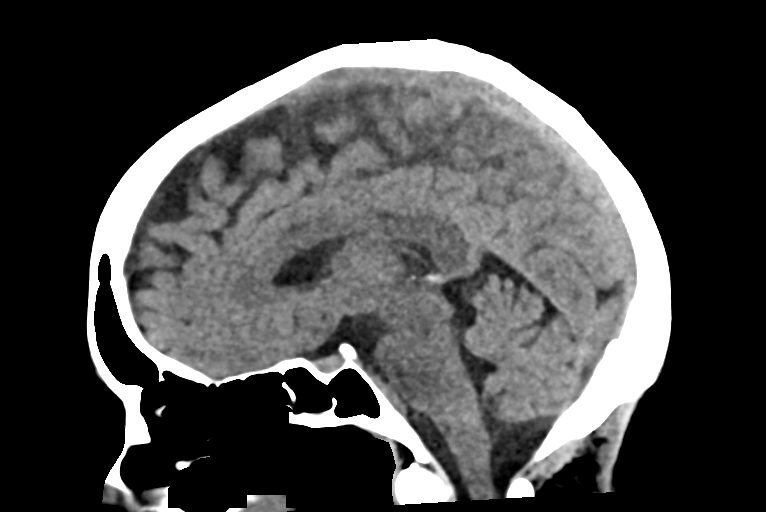
[im 38/57  brain]
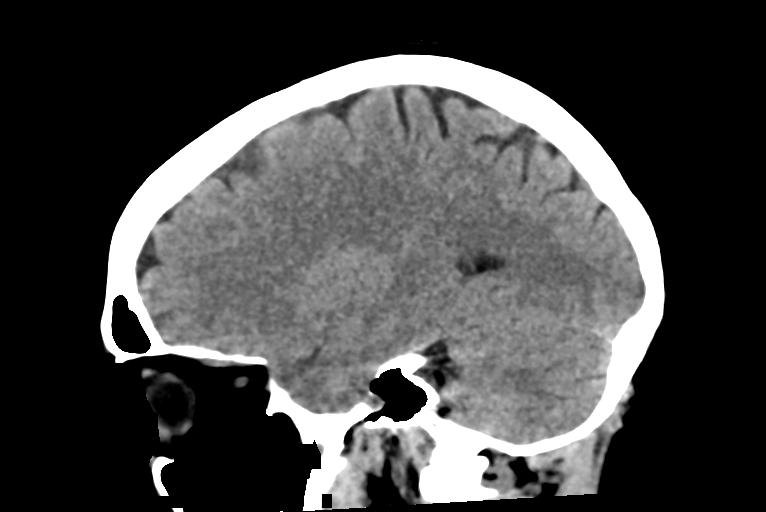

[16 of 47 positions shown; findings below may reference images not displayed]

FINDINGS: Brain: No evidence of acute infarction, hemorrhage, hydrocephalus,
extra-axial collection or mass lesion/mass effect.

Vascular: No hyperdense vessel or unexpected calcification.

Skull: Normal. Negative for fracture or focal lesion.

Sinuses/Orbits: No acute finding.

Other: None.
IMPRESSION: Normal head CT.

## 2020-04-06 ENCOUNTER — Encounter: Payer: Self-pay | Admitting: Obstetrics & Gynecology

## 2020-04-06 ENCOUNTER — Ambulatory Visit (INDEPENDENT_AMBULATORY_CARE_PROVIDER_SITE_OTHER): Payer: No Typology Code available for payment source | Admitting: Obstetrics & Gynecology

## 2020-04-06 ENCOUNTER — Other Ambulatory Visit: Payer: Self-pay

## 2020-04-06 VITALS — BP 130/80 | Ht 66.0 in | Wt 161.0 lb

## 2020-04-06 DIAGNOSIS — D259 Leiomyoma of uterus, unspecified: Secondary | ICD-10-CM | POA: Diagnosis not present

## 2020-04-06 DIAGNOSIS — N951 Menopausal and female climacteric states: Secondary | ICD-10-CM

## 2020-04-06 NOTE — Progress Notes (Signed)
    Julie Hanson 12-26-1965 [id number]        55 y.o.  G1P1L1   RP: Large uterine fibroids with feeling of fullness and pressure  HPI: On Ovcon 35 BCPs continuously for many years.  No vaginal bleeding. No hot flushes or night sweats.  Has experienced withdrawal migraines each time she has stopped the pills.  No severe abdominopelvic pains, but has a feeling of fullness and pressure from her very large uterus.  Patient can feel the fibroids especially at the Rt upper abdomen and just below her ribs in the midline. Wonders if it is contributing to her gastric reflux.  No pain with IC. Prefers not to have a hysterectomy.  Urine and BMs normal.   OB History  Gravida Para Term Preterm AB Living  1 1       1   SAB IAB Ectopic Multiple Live Births               # Outcome Date GA Lbr Len/2nd Weight Sex Delivery Anes PTL Lv  1 Para             Past medical history,surgical history, problem list, medications, allergies, family history and social history were all reviewed and documented in the EPIC chart.   Directed ROS with pertinent positives and negatives documented in the history of present illness/assessment and plan.  Exam:  Vitals:   04/06/20 0938  BP: 130/80  Weight: 161 lb (73 kg)  Height: 5\' 6"  (1.676 m)   General appearance:  Normal  Abdomen: Very large and nodular with UH at 28 cm.  Gynecologic exam: Vulva normal.  Bimanual exam:  Uterus very enlarged and nodular, mobile, large fibroids in the Rt Upper Abdominal Quadrant.  Not very bulky in the pelvis.  No adnexal mass felt, NT.   Assessment/Plan:  55 y.o. G1P1   1. Uterine leiomyoma, unspecified location Very large uterine myomas with the uterine height at 28 cm and fibroids felt on the right lateral side close to the liver area.  Patient will follow up for a pelvic ultrasound to evaluate the appearance of the fibroids as well as their size and location.  Patient will also stop her birth control pill a week prior to  the ultrasound to perform an [facility] to evaluate her menopausal status.  We will decide on management depending on those results.  If menopause is confirmed patient could stop the estrogen-progestin pill and switch to a progestin only pill for contraception.  If patient is not in menopause, we could stop her birth control pills and start on Depo-Lupron in an attempt to shrink the fibroids.  May consider Sonata if significant fibroids or accessible to decrease the bulk of her uterus.  Will also discuss the risks and benefits of hysterectomy depending on the results at that visit. - US PELVIS TRANSVAGINAL NON-OB (TV ONLY); Future  2. Perimenopause Will verify patient's menopausal status.  Will stop the birth control pill x1 week before drying an [place]. - [facility]; Future  Other orders - famotidine (PEPCID) 20 MG tablet; Take 20 mg by mouth 2 (two) times daily.  Princess Bruins MD, 10:28 AM 04/06/2020

## 2020-04-07 DIAGNOSIS — K219 Gastro-esophageal reflux disease without esophagitis: Secondary | ICD-10-CM | POA: Insufficient documentation

## 2020-04-07 DIAGNOSIS — R09A2 Foreign body sensation, throat: Secondary | ICD-10-CM | POA: Insufficient documentation

## 2020-05-03 ENCOUNTER — Other Ambulatory Visit: Payer: No Typology Code available for payment source

## 2020-05-03 ENCOUNTER — Other Ambulatory Visit: Payer: No Typology Code available for payment source | Admitting: Obstetrics & Gynecology

## 2020-05-03 DIAGNOSIS — Z0289 Encounter for other administrative examinations: Secondary | ICD-10-CM

## 2021-04-04 DIAGNOSIS — M9905 Segmental and somatic dysfunction of pelvic region: Secondary | ICD-10-CM | POA: Diagnosis not present

## 2021-04-04 DIAGNOSIS — M5451 Vertebrogenic low back pain: Secondary | ICD-10-CM | POA: Diagnosis not present

## 2021-04-04 DIAGNOSIS — M25512 Pain in left shoulder: Secondary | ICD-10-CM | POA: Diagnosis not present

## 2021-04-04 DIAGNOSIS — M546 Pain in thoracic spine: Secondary | ICD-10-CM | POA: Diagnosis not present

## 2021-04-04 DIAGNOSIS — M9901 Segmental and somatic dysfunction of cervical region: Secondary | ICD-10-CM | POA: Diagnosis not present

## 2021-04-04 DIAGNOSIS — M9904 Segmental and somatic dysfunction of sacral region: Secondary | ICD-10-CM | POA: Diagnosis not present

## 2021-04-06 DIAGNOSIS — M546 Pain in thoracic spine: Secondary | ICD-10-CM | POA: Diagnosis not present

## 2021-04-06 DIAGNOSIS — M9905 Segmental and somatic dysfunction of pelvic region: Secondary | ICD-10-CM | POA: Diagnosis not present

## 2021-04-06 DIAGNOSIS — M9904 Segmental and somatic dysfunction of sacral region: Secondary | ICD-10-CM | POA: Diagnosis not present

## 2021-04-06 DIAGNOSIS — M9901 Segmental and somatic dysfunction of cervical region: Secondary | ICD-10-CM | POA: Diagnosis not present

## 2021-04-10 DIAGNOSIS — M9904 Segmental and somatic dysfunction of sacral region: Secondary | ICD-10-CM | POA: Diagnosis not present

## 2021-04-10 DIAGNOSIS — M9901 Segmental and somatic dysfunction of cervical region: Secondary | ICD-10-CM | POA: Diagnosis not present

## 2021-04-10 DIAGNOSIS — M9905 Segmental and somatic dysfunction of pelvic region: Secondary | ICD-10-CM | POA: Diagnosis not present

## 2021-04-10 DIAGNOSIS — M546 Pain in thoracic spine: Secondary | ICD-10-CM | POA: Diagnosis not present

## 2021-04-11 DIAGNOSIS — M9905 Segmental and somatic dysfunction of pelvic region: Secondary | ICD-10-CM | POA: Diagnosis not present

## 2021-04-11 DIAGNOSIS — M9901 Segmental and somatic dysfunction of cervical region: Secondary | ICD-10-CM | POA: Diagnosis not present

## 2021-04-11 DIAGNOSIS — M9904 Segmental and somatic dysfunction of sacral region: Secondary | ICD-10-CM | POA: Diagnosis not present

## 2021-04-11 DIAGNOSIS — M546 Pain in thoracic spine: Secondary | ICD-10-CM | POA: Diagnosis not present

## 2021-04-13 DIAGNOSIS — M546 Pain in thoracic spine: Secondary | ICD-10-CM | POA: Diagnosis not present

## 2021-04-13 DIAGNOSIS — M9905 Segmental and somatic dysfunction of pelvic region: Secondary | ICD-10-CM | POA: Diagnosis not present

## 2021-04-13 DIAGNOSIS — M9901 Segmental and somatic dysfunction of cervical region: Secondary | ICD-10-CM | POA: Diagnosis not present

## 2021-04-13 DIAGNOSIS — M9904 Segmental and somatic dysfunction of sacral region: Secondary | ICD-10-CM | POA: Diagnosis not present

## 2021-04-17 DIAGNOSIS — M9904 Segmental and somatic dysfunction of sacral region: Secondary | ICD-10-CM | POA: Diagnosis not present

## 2021-04-17 DIAGNOSIS — M546 Pain in thoracic spine: Secondary | ICD-10-CM | POA: Diagnosis not present

## 2021-04-17 DIAGNOSIS — M9901 Segmental and somatic dysfunction of cervical region: Secondary | ICD-10-CM | POA: Diagnosis not present

## 2021-04-17 DIAGNOSIS — M9905 Segmental and somatic dysfunction of pelvic region: Secondary | ICD-10-CM | POA: Diagnosis not present

## 2021-04-18 DIAGNOSIS — M9905 Segmental and somatic dysfunction of pelvic region: Secondary | ICD-10-CM | POA: Diagnosis not present

## 2021-04-18 DIAGNOSIS — M9904 Segmental and somatic dysfunction of sacral region: Secondary | ICD-10-CM | POA: Diagnosis not present

## 2021-04-18 DIAGNOSIS — M9901 Segmental and somatic dysfunction of cervical region: Secondary | ICD-10-CM | POA: Diagnosis not present

## 2021-04-18 DIAGNOSIS — M546 Pain in thoracic spine: Secondary | ICD-10-CM | POA: Diagnosis not present

## 2021-04-20 DIAGNOSIS — M9901 Segmental and somatic dysfunction of cervical region: Secondary | ICD-10-CM | POA: Diagnosis not present

## 2021-04-20 DIAGNOSIS — M546 Pain in thoracic spine: Secondary | ICD-10-CM | POA: Diagnosis not present

## 2021-04-20 DIAGNOSIS — M9905 Segmental and somatic dysfunction of pelvic region: Secondary | ICD-10-CM | POA: Diagnosis not present

## 2021-04-20 DIAGNOSIS — M9904 Segmental and somatic dysfunction of sacral region: Secondary | ICD-10-CM | POA: Diagnosis not present

## 2021-04-24 DIAGNOSIS — M9901 Segmental and somatic dysfunction of cervical region: Secondary | ICD-10-CM | POA: Diagnosis not present

## 2021-04-24 DIAGNOSIS — M9905 Segmental and somatic dysfunction of pelvic region: Secondary | ICD-10-CM | POA: Diagnosis not present

## 2021-04-24 DIAGNOSIS — M9904 Segmental and somatic dysfunction of sacral region: Secondary | ICD-10-CM | POA: Diagnosis not present

## 2021-04-24 DIAGNOSIS — M546 Pain in thoracic spine: Secondary | ICD-10-CM | POA: Diagnosis not present

## 2021-04-24 DIAGNOSIS — M5451 Vertebrogenic low back pain: Secondary | ICD-10-CM | POA: Diagnosis not present

## 2021-04-25 DIAGNOSIS — M5412 Radiculopathy, cervical region: Secondary | ICD-10-CM | POA: Diagnosis not present

## 2021-04-25 DIAGNOSIS — M9904 Segmental and somatic dysfunction of sacral region: Secondary | ICD-10-CM | POA: Diagnosis not present

## 2021-04-25 DIAGNOSIS — M9905 Segmental and somatic dysfunction of pelvic region: Secondary | ICD-10-CM | POA: Diagnosis not present

## 2021-04-25 DIAGNOSIS — M9901 Segmental and somatic dysfunction of cervical region: Secondary | ICD-10-CM | POA: Diagnosis not present

## 2021-04-25 DIAGNOSIS — M21752 Unequal limb length (acquired), left femur: Secondary | ICD-10-CM | POA: Diagnosis not present

## 2021-04-25 DIAGNOSIS — M9902 Segmental and somatic dysfunction of thoracic region: Secondary | ICD-10-CM | POA: Diagnosis not present

## 2021-05-01 DIAGNOSIS — M9905 Segmental and somatic dysfunction of pelvic region: Secondary | ICD-10-CM | POA: Diagnosis not present

## 2021-05-01 DIAGNOSIS — M9901 Segmental and somatic dysfunction of cervical region: Secondary | ICD-10-CM | POA: Diagnosis not present

## 2021-05-01 DIAGNOSIS — M9904 Segmental and somatic dysfunction of sacral region: Secondary | ICD-10-CM | POA: Diagnosis not present

## 2021-05-01 DIAGNOSIS — M546 Pain in thoracic spine: Secondary | ICD-10-CM | POA: Diagnosis not present

## 2021-05-04 DIAGNOSIS — M546 Pain in thoracic spine: Secondary | ICD-10-CM | POA: Diagnosis not present

## 2021-05-04 DIAGNOSIS — M9901 Segmental and somatic dysfunction of cervical region: Secondary | ICD-10-CM | POA: Diagnosis not present

## 2021-05-04 DIAGNOSIS — M9904 Segmental and somatic dysfunction of sacral region: Secondary | ICD-10-CM | POA: Diagnosis not present

## 2021-05-04 DIAGNOSIS — M9905 Segmental and somatic dysfunction of pelvic region: Secondary | ICD-10-CM | POA: Diagnosis not present

## 2021-05-15 DIAGNOSIS — M9904 Segmental and somatic dysfunction of sacral region: Secondary | ICD-10-CM | POA: Diagnosis not present

## 2021-05-15 DIAGNOSIS — M9905 Segmental and somatic dysfunction of pelvic region: Secondary | ICD-10-CM | POA: Diagnosis not present

## 2021-05-15 DIAGNOSIS — M9901 Segmental and somatic dysfunction of cervical region: Secondary | ICD-10-CM | POA: Diagnosis not present

## 2021-05-15 DIAGNOSIS — M546 Pain in thoracic spine: Secondary | ICD-10-CM | POA: Diagnosis not present

## 2021-05-19 DIAGNOSIS — M9905 Segmental and somatic dysfunction of pelvic region: Secondary | ICD-10-CM | POA: Diagnosis not present

## 2021-05-19 DIAGNOSIS — M9904 Segmental and somatic dysfunction of sacral region: Secondary | ICD-10-CM | POA: Diagnosis not present

## 2021-05-19 DIAGNOSIS — M546 Pain in thoracic spine: Secondary | ICD-10-CM | POA: Diagnosis not present

## 2021-05-19 DIAGNOSIS — M9901 Segmental and somatic dysfunction of cervical region: Secondary | ICD-10-CM | POA: Diagnosis not present

## 2021-05-23 DIAGNOSIS — M5451 Vertebrogenic low back pain: Secondary | ICD-10-CM | POA: Diagnosis not present

## 2021-05-23 DIAGNOSIS — M9905 Segmental and somatic dysfunction of pelvic region: Secondary | ICD-10-CM | POA: Diagnosis not present

## 2021-05-23 DIAGNOSIS — M546 Pain in thoracic spine: Secondary | ICD-10-CM | POA: Diagnosis not present

## 2021-05-23 DIAGNOSIS — M9901 Segmental and somatic dysfunction of cervical region: Secondary | ICD-10-CM | POA: Diagnosis not present

## 2021-05-23 DIAGNOSIS — M9904 Segmental and somatic dysfunction of sacral region: Secondary | ICD-10-CM | POA: Diagnosis not present

## 2021-05-26 DIAGNOSIS — M546 Pain in thoracic spine: Secondary | ICD-10-CM | POA: Diagnosis not present

## 2021-05-26 DIAGNOSIS — M9901 Segmental and somatic dysfunction of cervical region: Secondary | ICD-10-CM | POA: Diagnosis not present

## 2021-05-26 DIAGNOSIS — M9904 Segmental and somatic dysfunction of sacral region: Secondary | ICD-10-CM | POA: Diagnosis not present

## 2021-05-26 DIAGNOSIS — M9905 Segmental and somatic dysfunction of pelvic region: Secondary | ICD-10-CM | POA: Diagnosis not present

## 2021-06-02 DIAGNOSIS — M546 Pain in thoracic spine: Secondary | ICD-10-CM | POA: Diagnosis not present

## 2021-06-02 DIAGNOSIS — M9904 Segmental and somatic dysfunction of sacral region: Secondary | ICD-10-CM | POA: Diagnosis not present

## 2021-06-02 DIAGNOSIS — M9901 Segmental and somatic dysfunction of cervical region: Secondary | ICD-10-CM | POA: Diagnosis not present

## 2021-06-02 DIAGNOSIS — M9905 Segmental and somatic dysfunction of pelvic region: Secondary | ICD-10-CM | POA: Diagnosis not present

## 2021-06-13 DIAGNOSIS — M5412 Radiculopathy, cervical region: Secondary | ICD-10-CM | POA: Diagnosis not present

## 2021-06-13 DIAGNOSIS — M9902 Segmental and somatic dysfunction of thoracic region: Secondary | ICD-10-CM | POA: Diagnosis not present

## 2021-06-13 DIAGNOSIS — M546 Pain in thoracic spine: Secondary | ICD-10-CM | POA: Diagnosis not present

## 2021-06-13 DIAGNOSIS — M9901 Segmental and somatic dysfunction of cervical region: Secondary | ICD-10-CM | POA: Diagnosis not present

## 2021-06-13 DIAGNOSIS — M9905 Segmental and somatic dysfunction of pelvic region: Secondary | ICD-10-CM | POA: Diagnosis not present

## 2021-06-13 DIAGNOSIS — M21752 Unequal limb length (acquired), left femur: Secondary | ICD-10-CM | POA: Diagnosis not present

## 2021-06-13 DIAGNOSIS — M9904 Segmental and somatic dysfunction of sacral region: Secondary | ICD-10-CM | POA: Diagnosis not present

## 2021-06-19 DIAGNOSIS — T1511XA Foreign body in conjunctival sac, right eye, initial encounter: Secondary | ICD-10-CM | POA: Diagnosis not present

## 2021-06-23 DIAGNOSIS — M9905 Segmental and somatic dysfunction of pelvic region: Secondary | ICD-10-CM | POA: Diagnosis not present

## 2021-06-23 DIAGNOSIS — M9901 Segmental and somatic dysfunction of cervical region: Secondary | ICD-10-CM | POA: Diagnosis not present

## 2021-06-23 DIAGNOSIS — M9904 Segmental and somatic dysfunction of sacral region: Secondary | ICD-10-CM | POA: Diagnosis not present

## 2021-06-23 DIAGNOSIS — M546 Pain in thoracic spine: Secondary | ICD-10-CM | POA: Diagnosis not present

## 2021-06-28 DIAGNOSIS — Z1329 Encounter for screening for other suspected endocrine disorder: Secondary | ICD-10-CM | POA: Diagnosis not present

## 2021-06-28 DIAGNOSIS — Z6826 Body mass index (BMI) 26.0-26.9, adult: Secondary | ICD-10-CM | POA: Diagnosis not present

## 2021-06-28 DIAGNOSIS — E785 Hyperlipidemia, unspecified: Secondary | ICD-10-CM | POA: Diagnosis not present

## 2021-06-28 DIAGNOSIS — Z13228 Encounter for screening for other metabolic disorders: Secondary | ICD-10-CM | POA: Diagnosis not present

## 2021-06-28 DIAGNOSIS — Z131 Encounter for screening for diabetes mellitus: Secondary | ICD-10-CM | POA: Diagnosis not present

## 2021-06-28 DIAGNOSIS — Z01419 Encounter for gynecological examination (general) (routine) without abnormal findings: Secondary | ICD-10-CM | POA: Diagnosis not present

## 2021-06-28 DIAGNOSIS — Z130 Encounter for screening for diseases of the blood and blood-forming organs and certain disorders involving the immune mechanism: Secondary | ICD-10-CM | POA: Diagnosis not present

## 2021-06-28 DIAGNOSIS — Z124 Encounter for screening for malignant neoplasm of cervix: Secondary | ICD-10-CM | POA: Diagnosis not present

## 2021-06-28 DIAGNOSIS — Z1231 Encounter for screening mammogram for malignant neoplasm of breast: Secondary | ICD-10-CM | POA: Diagnosis not present

## 2021-06-28 DIAGNOSIS — Z13820 Encounter for screening for osteoporosis: Secondary | ICD-10-CM | POA: Diagnosis not present

## 2021-06-28 DIAGNOSIS — E559 Vitamin D deficiency, unspecified: Secondary | ICD-10-CM | POA: Diagnosis not present

## 2021-06-28 LAB — HM PAP SMEAR: HM Pap smear: NEGATIVE

## 2021-06-28 LAB — HM DEXA SCAN

## 2021-07-13 DIAGNOSIS — D259 Leiomyoma of uterus, unspecified: Secondary | ICD-10-CM | POA: Diagnosis not present

## 2021-09-22 ENCOUNTER — Ambulatory Visit (HOSPITAL_COMMUNITY)
Admission: EM | Admit: 2021-09-22 | Discharge: 2021-09-22 | Disposition: A | Discharge status: Home / Self Care | Payer: BC Managed Care – PPO | Attending: Physician Assistant | Admitting: Physician Assistant

## 2021-09-22 ENCOUNTER — Encounter (HOSPITAL_COMMUNITY): Payer: Self-pay

## 2021-09-22 DIAGNOSIS — R300 Dysuria: Secondary | ICD-10-CM | POA: Diagnosis not present

## 2021-09-22 DIAGNOSIS — N3001 Acute cystitis with hematuria: Secondary | ICD-10-CM | POA: Diagnosis not present

## 2021-09-22 LAB — POCT URINALYSIS DIPSTICK, ED / UC
Bilirubin Urine: NEGATIVE
Glucose, UA: NEGATIVE mg/dL
Ketones, ur: NEGATIVE mg/dL
Nitrite: NEGATIVE
Protein, ur: >=300 mg/dL — AB
Specific Gravity, Urine: 1.025 (ref 1.005–1.030)
Urobilinogen, UA: 0.2 mg/dL (ref 0.0–1.0)
pH: 6 (ref 5.0–8.0)

## 2021-09-22 MED ORDER — NITROFURANTOIN MONOHYD MACRO 100 MG PO CAPS: 100.0000 mg | ORAL_CAPSULE | Freq: Two times a day (BID) | ORAL | 0 refills | Status: DC

## 2021-09-22 NOTE — ED Triage Notes (Signed)
Pt states burning with urination for the past 4 days.

## 2021-09-22 NOTE — Discharge Instructions (Signed)
You have a urinary tract infection.  Please start nitrofurantoin twice daily for 5 days.  We will culture your urine and if we need to change your antibiotic we will contact you.  Antibiotics can decrease the effectiveness of your birth control so please use backup birth control while on this medication.  Make sure you rest and drink plenty of fluid.  If you develop any worsening symptoms including abdominal pain, pelvic pain, fever, nausea, vomiting, back pain you should be seen immediately.

## 2021-09-22 NOTE — ED Provider Notes (Signed)
[employer]    CSN: [id number] Arrival date & time: 09/22/21  1943      History   Chief Complaint Chief Complaint  Patient presents with   Dysuria    HPI Julie Hanson is a 56 y.o. female.   Patient resents today with 4-day history of urinary symptoms.  Reports dysuria, urinary frequency, urinary urgency.  Denies any vaginal symptoms including discharge, bleeding, pain.  Denies any abdominal pain, nausea, vomiting, fever.  She denies any recent urogenital procedures, self-catheterization, history of nephrolithiasis, seeing a urologist recently.  Denies history of diabetes and does not take SGLT2 inhibitor.  Denies any recent antibiotics.  She has not tried any over-the-counter medication for symptom management.    Past Medical History:  Diagnosis Date   Irregular heart beat     Patient Active Problem List   Diagnosis Date Noted   Leg pain 01/07/2014   Tachycardia, paroxysmal (Man) 01/08/2013   DOE (dyspnea on exertion) 01/08/2013   Fatigue 01/08/2013    Past Surgical History:  Procedure Laterality Date   UTERINE FIBROID SURGERY  03/2012   Dr. Daron Offer    OB History     Gravida  1   Para  1   Term      Preterm      AB      Living  1      SAB      IAB      Ectopic      Multiple      Live Births               Home Medications    Prior to Admission medications   Medication Sig Start Date End Date Taking? Authorizing Provider  famotidine (PEPCID) 20 MG tablet Take 20 mg by mouth 2 (two) times daily.    [provider]  loratadine (CLARITIN) 10 MG tablet Take 10 mg by mouth daily as needed for allergies.    [provider]  nitrofurantoin, macrocrystal-monohydrate, (MACROBID) 100 MG capsule Take 1 capsule (100 mg total) by mouth 2 (two) times daily. 09/22/21   Talik Casique, Derry Skill, PA-C  norethindrone-ethinyl estradiol (OVCON-35) 0.4-35 MG-MCG tablet Take 1 tablet by mouth daily.    [provider]    Family  History Family History  Problem Relation Age of Onset   Cancer Mother        bladder   Diabetes Father    Hypertension Father    Diabetes Maternal Grandmother    Cancer Maternal Grandfather        leukemia   Cancer Paternal Grandmother        throat   Diabetes Paternal Grandmother     Social History Social History   Tobacco Use   Smoking status: Never   Smokeless tobacco: Never  Vaping Use   Vaping Use: Never used  Substance Use Topics   Alcohol use: Yes    Alcohol/week: 2.0 - 3.0 standard drinks of alcohol    Types: 2 - 3 Standard drinks or equivalent per week   Drug use: Never     Allergies   Sulfa antibiotics   Review of Systems Review of Systems  Constitutional:  Positive for activity change. Negative for appetite change, fatigue and fever.  Respiratory:  Negative for cough and shortness of breath.   Cardiovascular:  Negative for chest pain.  Gastrointestinal:  Negative for abdominal pain, diarrhea, nausea and vomiting.  Genitourinary:  Positive for dysuria, frequency and urgency. Negative for  flank pain, hematuria, pelvic pain, vaginal bleeding, vaginal discharge and vaginal pain.  Musculoskeletal:  Negative for arthralgias and myalgias.     Physical Exam Triage Vital Signs ED Triage Vitals  Enc Vitals Group     BP 09/22/21 1951 (!) 154/97     Pulse Rate 09/22/21 1951 84     Resp 09/22/21 1951 16     Temp 09/22/21 1951 97.9 F (36.6 C)     Temp Source 09/22/21 1951 Oral     SpO2 09/22/21 1951 98 %     Weight --      Height --      Head Circumference --      Peak Flow --      Pain Score 09/22/21 1952 7     Pain Loc --      Pain Edu? --      Excl. in [place]? --    No data found.  Updated Vital Signs BP (!) 154/97 (BP Location: Left Arm)   Pulse 84   Temp 97.9 F (36.6 C) (Oral)   Resp 16   LMP 03/16/2015   SpO2 98%   Visual Acuity Right Eye Distance:   Left Eye Distance:   Bilateral Distance:    Right Eye Near:   Left Eye Near:     Bilateral Near:     Physical Exam Vitals reviewed.  Constitutional:      General: She is awake. She is not in acute distress.    Appearance: Normal appearance. She is well-developed. She is not ill-appearing.     Comments: Very pleasant female appears stated age in no acute distress sitting comfortably in exam room  HENT:     Head: Normocephalic and atraumatic.  Cardiovascular:     Rate and Rhythm: Normal rate and regular rhythm.     Heart sounds: Normal heart sounds, S1 normal and S2 normal. No murmur heard. Pulmonary:     Effort: Pulmonary effort is normal.     Breath sounds: Normal breath sounds. No wheezing, rhonchi or rales.     Comments: Clear to auscultation bilaterally Abdominal:     General: Bowel sounds are normal.     Palpations: Abdomen is soft.     Tenderness: There is no abdominal tenderness. There is no right CVA tenderness, left CVA tenderness, guarding or rebound.     Comments: Benign abdominal exam  Psychiatric:        Behavior: Behavior is cooperative.      UC Treatments / Results  Labs (all labs ordered are listed, but only abnormal results are displayed) Labs Reviewed  POCT URINALYSIS DIPSTICK, ED / UC - Abnormal; Notable for the following components:      Result Value   Hgb urine dipstick MODERATE (*)    Protein, ur >=300 (*)    Leukocytes,Ua MODERATE (*)    All other components within normal limits  URINE CULTURE    EKG   Radiology No results found.  Procedures Procedures (including critical care time)  Medications Ordered in UC Medications - No data to display  Initial Impression / Assessment and Plan / UC Course  I have reviewed the triage vital signs and the nursing notes.  Pertinent labs & imaging results that were available during my care of the patient were reviewed by me and considered in my medical decision making (see chart for details).     UA shows evidence of infection.  Patient was started on Macrobid twice daily for 5  days.  We will send this off for culture but contact her if we need to change her antibiotics based on susceptibilities identified on culture.  She is to rest and drink plenty of fluid.  Discussed that if she has any worsening symptoms she needs to be seen immediately.  Strict return precautions given.  Final Clinical Impressions(s) / UC Diagnoses   Final diagnoses:  Acute cystitis with hematuria  Dysuria     Discharge Instructions      You have a urinary tract infection.  Please start nitrofurantoin twice daily for 5 days.  We will culture your urine and if we need to change your antibiotic we will contact you.  Antibiotics can decrease the effectiveness of your birth control so please use backup birth control while on this medication.  Make sure you rest and drink plenty of fluid.  If you develop any worsening symptoms including abdominal pain, pelvic pain, fever, nausea, vomiting, back pain you should be seen immediately.    ED Prescriptions     Medication Sig Dispense Auth. Provider   nitrofurantoin, macrocrystal-monohydrate, (MACROBID) 100 MG capsule  (Status: Discontinued) Take 1 capsule (100 mg total) by mouth 2 (two) times daily. 10 capsule Treacy Holcomb K, PA-C   nitrofurantoin, macrocrystal-monohydrate, (MACROBID) 100 MG capsule Take 1 capsule (100 mg total) by mouth 2 (two) times daily. 10 capsule Kassy Mcenroe, Derry Skill, PA-C      PDMP not reviewed this encounter.   Terrilee Croak, PA-C 09/22/21 2013

## 2021-09-25 LAB — URINE CULTURE: Culture: >=100000 — AB

## 2021-12-11 ENCOUNTER — Encounter: Payer: Self-pay | Admitting: Internal Medicine

## 2022-01-16 ENCOUNTER — Ambulatory Visit: Payer: BLUE CROSS/BLUE SHIELD | Admitting: Internal Medicine

## 2022-01-18 ENCOUNTER — Encounter: Payer: Self-pay | Admitting: Orthopedic Surgery

## 2022-01-18 ENCOUNTER — Ambulatory Visit (INDEPENDENT_AMBULATORY_CARE_PROVIDER_SITE_OTHER): Payer: BC Managed Care – PPO | Admitting: Orthopedic Surgery

## 2022-01-18 VITALS — BP 138/74 | HR 77 | Temp 98.1°F | Resp 16 | Ht 66.0 in | Wt 160.1 lb

## 2022-01-18 DIAGNOSIS — K649 Unspecified hemorrhoids: Secondary | ICD-10-CM

## 2022-01-18 DIAGNOSIS — Z1211 Encounter for screening for malignant neoplasm of colon: Secondary | ICD-10-CM

## 2022-01-18 DIAGNOSIS — J302 Other seasonal allergic rhinitis: Secondary | ICD-10-CM | POA: Insufficient documentation

## 2022-01-18 DIAGNOSIS — K219 Gastro-esophageal reflux disease without esophagitis: Secondary | ICD-10-CM | POA: Diagnosis not present

## 2022-01-18 DIAGNOSIS — F338 Other recurrent depressive disorders: Secondary | ICD-10-CM

## 2022-01-18 DIAGNOSIS — Z6825 Body mass index (BMI) 25.0-25.9, adult: Secondary | ICD-10-CM | POA: Insufficient documentation

## 2022-01-18 DIAGNOSIS — D259 Leiomyoma of uterus, unspecified: Secondary | ICD-10-CM

## 2022-01-18 DIAGNOSIS — Z114 Encounter for screening for human immunodeficiency virus [HIV]: Secondary | ICD-10-CM

## 2022-01-18 DIAGNOSIS — Z1159 Encounter for screening for other viral diseases: Secondary | ICD-10-CM

## 2022-01-18 DIAGNOSIS — Z780 Asymptomatic menopausal state: Secondary | ICD-10-CM

## 2022-01-18 DIAGNOSIS — R030 Elevated blood-pressure reading, without diagnosis of hypertension: Secondary | ICD-10-CM

## 2022-01-18 NOTE — Patient Instructions (Addendum)
[facility] [address] > [facility]

## 2022-01-18 NOTE — Progress Notes (Signed)
Careteam: Patient Care Team: Louretta Shorten, MD as PCP - General (Obstetrics and Gynecology) Louretta Shorten, MD as Consulting Physician (Obstetrics and Gynecology)  Seen by: Windell Moulding, AGNP-C  PLACE OF SERVICE:  [facility] Directive information    Allergies  Allergen Reactions   Sulfa Antibiotics Other (See Comments)    Chief Complaint  Patient presents with   Establish Care    New Patient Visit      HPI: Patient is a 57 y.o. female seen today to establish at [facility].   Provider was gynecologist Dr. Louretta Shorten. Never had PCP. Lives in [place]. From [facility]. Single. 1 son aged 45. 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 Like to spend time outdoors.   No MI, T2DM, or cancer.   GERD- onset about 20 years. Believes food trigger is sugar. She is taking famotidine at least one dose daily, plans to discuss EGD with GI 02/20/2022  Allergies- described as seasonal, taking Claritin prn   Hemorrhoids- notices some bleeding every 3-4 weeks. Reports moderate amount of blood 1-2x then resolves. Describes stools as hard.   Menopause/uterine fibroids- LMP > 1 year, past use of birth control to help with fibroids> they did reduce in size, recommended she have hysterectomy  Depression- increased blues this time of year, blues began after son graduated college and moved out, sometimes she does not sleep well> every 5-6 days, no plan to harm self, would like to try therapy   Recent hospitalizations:  None  ED visit 07/2017> syncope> CT head unremarkable  Surgeries:  C-section 23 years  Uterine fibroid surgery in 2014   Colonoscopy- colonoscopy and EGD 02/20 with Dr. Lorenso Courier  Mammogram- managed by gynecology, done a few months ago  DEXA- completed within past year, believes osteopenia   Family history:  Mother age 64 years old. Bladder cancer> in remission.   Father age 31 years old. COPD, heart disease> past MI, HTN, T2DM  Brother aged 56, muscular dystrophy  Maternal  grandmother- alzheimer's dementia, T2DM Maternal grandfather- leukemia Paternal grandfather- COPD Paternal grandmother- osteoporosis, T2DM, caner> unknown   Smoking: never smoked cigarettes Alcohol: 1 drink occasional> not even monthly  Illicit drugs: no past use or abuse  Diet: does not drink soda, eats one meal daily> protein and 2 vegetables, follows low carb, does not eat sweets often Exercise: does not have exercise regimen  Dental: goes every 6 months Eye: goes yearly   Does not have living will.    Review of Systems:  Review of Systems  Constitutional: Negative.   HENT: Negative.    Eyes: Negative.   Respiratory: Negative.    Cardiovascular: Negative.   Gastrointestinal:  Positive for blood in stool and heartburn.       Hemorrhoids  Genitourinary: Negative.   Musculoskeletal: Negative.   Skin: Negative.   Neurological: Negative.   Endo/Heme/Allergies:  Positive for environmental allergies.  Psychiatric/Behavioral:  Positive for depression. The patient has insomnia.     Past Medical History:  Diagnosis Date   Irregular heart beat    Past Surgical History:  Procedure Laterality Date   UTERINE FIBROID SURGERY  03/2012   Dr. Daron Offer   Social History:   reports that she has never smoked. She has never used smokeless tobacco. She reports current alcohol use of about 2.0 - 3.0 standard drinks of alcohol per week. She reports that she does not use drugs.  Family History  Problem Relation Age of Onset   Cancer Mother  bladder   Diabetes Father    Hypertension Father    Diabetes Maternal Grandmother    Cancer Maternal Grandfather        leukemia   Cancer Paternal Grandmother        throat   Diabetes Paternal Grandmother     Medications: Patient's Medications  New Prescriptions   No medications on file  Previous Medications   FAMOTIDINE (PEPCID) 20 MG TABLET    Take 20 mg by mouth 2 (two) times daily.   LORATADINE (CLARITIN) 10 MG TABLET    Take 10  mg by mouth daily as needed for allergies.   NITROFURANTOIN, MACROCRYSTAL-MONOHYDRATE, (MACROBID) 100 MG CAPSULE    Take 1 capsule (100 mg total) by mouth 2 (two) times daily.   NORETHINDRONE-ETHINYL ESTRADIOL (OVCON-35) 0.4-35 MG-MCG TABLET    Take 1 tablet by mouth daily.  Modified Medications   No medications on file  Discontinued Medications   No medications on file    Physical Exam:  There were no vitals filed for this visit. There is no height or weight on file to calculate BMI. Wt Readings from Last 3 Encounters:  04/06/20 161 lb (73 kg)  07/02/17 150 lb (68 kg)  04/01/15 140 lb (63.5 kg)    Physical Exam Vitals reviewed.  Constitutional:      General: She is not in acute distress. HENT:     Head: Normocephalic.  Eyes:     General:        Right eye: No discharge.        Left eye: No discharge.  Cardiovascular:     Rate and Rhythm: Normal rate and regular rhythm.     Pulses: Normal pulses.     Heart sounds: Normal heart sounds.  Pulmonary:     Effort: Pulmonary effort is normal. No respiratory distress.     Breath sounds: Normal breath sounds. No wheezing.  Abdominal:     General: Bowel sounds are normal.     Palpations: Abdomen is soft.  Musculoskeletal:     Cervical back: Neck supple.     Right lower leg: No edema.     Left lower leg: No edema.  Skin:    General: Skin is warm and dry.     Capillary Refill: Capillary refill takes less than 2 seconds.  Neurological:     General: No focal deficit present.     Mental Status: She is alert and oriented to person, place, and time.  Psychiatric:        Mood and Affect: Mood normal.        Behavior: Behavior normal.     Labs reviewed: Basic Metabolic Panel: No results for input(s): "NA", "K", "CL", "CO2", "GLUCOSE", "BUN", "CREATININE", "CALCIUM", "MG", "PHOS", "TSH" in the last 8760 hours. Liver Function Tests: No results for input(s): "AST", "ALT", "ALKPHOS", "BILITOT", "PROT", "ALBUMIN" in the last 8760  hours. No results for input(s): "LIPASE", "AMYLASE" in the last 8760 hours. No results for input(s): "AMMONIA" in the last 8760 hours. CBC: No results for input(s): "WBC", "NEUTROABS", "HGB", "HCT", "MCV", "PLT" in the last 8760 hours. Lipid Panel: No results for input(s): "CHOL", "HDL", "LDLCALC", "TRIG", "CHOLHDL", "LDLDIRECT" in the last 8760 hours. TSH: No results for input(s): "TSH" in the last 8760 hours. A1C: No results found for: "HGBA1C"   Assessment/Plan 1. Gastroesophageal reflux disease without esophagitis - 02/20/2022 plans to see GI for possible EGD - GERD x 20 years - reports sugars as trigger - cont famotidine - CBC  with Differential/Platelet; Future  2. Seasonal allergies - cont Claritin prn  3. Hemorrhoids, unspecified hemorrhoid type - notices blood every 3-4 weeks  - no abdominal pain - describes hard stools - cbc/diff- future - encourage hydration with water - recommend stool softener   4. Uterine leiomyoma, unspecified location - followed by gynecology - was on birth control for many years - hysterectomy recommended> refused by patient - denies pain/ abnormal bleeding  5. Post-menopause - followed by gynecology  6. BMI 25.0-25.9,adult - BMI 25.83 - Lipid Panel; Future  7. Seasonal affective disorder ([place]) - increased sadness this time of year, son recently moved out - PHQ 9 future - discussed counseling  - discussed exercise regimen - CMP with eGFR([facility]); Future  8. Elevated blood pressure reading - 140/84> 138/74 rechecked  - CMP with eGFR([facility]); Future - Lipid Panel; Future  9. Encounter for screening colonoscopy - scheduled to see GI 02/20/2022> Dr. Lorenso Courier  Future labs/tests: PHQ 9, cbc/diff, cmp, lipid panel, HIV, hep C screening  Total time: 46 minutes. Greater than 50% of total time spent doing patient education regarding health maintenance, GERD, allergies, seasonal depression and hemorrhoids, including symptom/medication  management.   Next appt: 03/01/2022  Windell Moulding, [facility] [phone]

## 2022-02-20 ENCOUNTER — Ambulatory Visit (INDEPENDENT_AMBULATORY_CARE_PROVIDER_SITE_OTHER): Payer: BC Managed Care – PPO | Admitting: Internal Medicine

## 2022-02-20 DIAGNOSIS — K219 Gastro-esophageal reflux disease without esophagitis: Secondary | ICD-10-CM

## 2022-02-20 DIAGNOSIS — K5900 Constipation, unspecified: Secondary | ICD-10-CM | POA: Diagnosis not present

## 2022-02-20 DIAGNOSIS — R1310 Dysphagia, unspecified: Secondary | ICD-10-CM

## 2022-02-20 DIAGNOSIS — Z1211 Encounter for screening for malignant neoplasm of colon: Secondary | ICD-10-CM

## 2022-02-20 MED ORDER — NA SULFATE-K SULFATE-MG SULF 17.5-3.13-1.6 GM/177ML PO SOLN: 1.0000 | ORAL | 0 refills | Status: DC

## 2022-02-20 NOTE — Patient Instructions (Addendum)
_______________________________________________________  If your blood pressure at your visit was 140/90 or greater, please contact your primary care physician to follow up on this.  If you are age 55 or younger, your body mass index should be between 19-25. Your Body mass index is 27.18 kg/m. If this is out of the aformentioned range listed, please consider follow up with your Primary Care Provider.  ________________________________________________________  The [facility] providers would like to encourage you to use [facility] to communicate with providers for non-urgent requests or questions.  Due to long hold times on the telephone, sending your provider a message by [facility] may be a faster and more efficient way to get a response.  Please allow 48 business hours for a response.  Please remember that this is for non-urgent requests.  _______________________________________________________  Drink 8 cups of water a day and walk 30 minutes a day.  Please purchase the following medications over the counter and take as directed: Fiber supplement such as Benefiber- use as directed daily Miralax: use as needed.  Please use Miralax daily for 7 days prior to colonoscopy.  You have been scheduled for an endoscopy and colonoscopy. Please follow the written instructions given to you at your visit today. Please pick up your prep supplies at the pharmacy within the next 1-3 days. If you use inhalers (even only as needed), please bring them with you on the day of your procedure.  Due to recent changes in healthcare laws, you may see the results of your imaging and laboratory studies on MyChart before your provider has had a chance to review them.  We understand that in some cases there may be results that are confusing or concerning to you. Not all laboratory results come back in the same time frame and the provider may be waiting for multiple results in order to interpret others.  Please give [place] 48 hours in  order for your provider to thoroughly review all the results before contacting the office for clarification of your results.   Thank you for entrusting me with your care and choosing [facility].  Dr Lorenso Courier

## 2022-02-20 NOTE — Progress Notes (Signed)
Chief Complaint: Hemorrhoids, GERD  HPI : 57 year old female with history of GERD, fibroids, depression, and hemorrhoids presents with hemorrhoids and GERD  She has had hemorrhoids for years. They will bleed on occasion, usually at least once a week with bright red blood. Endorses some straining. She has one BM once every other day. She has never had a colonoscopy in the past. She has had GERD for years. She is on omeprazole once a day and uses Pepcid at night. Endorses some chest burning. Denies regurgitation. Sometimes she will have back pain that she thinks is related to reflux. Endorses dysphagia that feels like choking. She will have dysphagia when she eats too fast. The dysphagia is to solids, not to liquids. This dysphagia has been going for years and has not worsened over time. Denies prior EGD. Denies family history of GI issues. Denies weight loss, ab pain, or N&V. Sometimes she will have discomfort due to fibroids. Denies having menstrual periods. Her labs recently have been normal.   Past Medical History:  Diagnosis Date   Depression    GERD (gastroesophageal reflux disease)    Hemorrhoids    Irregular heart beat    Past Surgical History:  Procedure Laterality Date   UTERINE FIBROID SURGERY  03/01/2012   Dr. Daron Offer   Family History  Problem Relation Age of Onset   Cancer Mother        bladder   Diabetes Father    Hypertension Father    Diabetes Maternal Grandmother    Cancer Maternal Grandfather        leukemia   Cancer Paternal Grandmother        throat   Diabetes Paternal Grandmother    Colon cancer Neg Hx    Esophageal cancer Neg Hx    Stomach cancer Neg Hx    Social History   Tobacco Use   Smoking status: Never   Smokeless tobacco: Never  Vaping Use   Vaping Use: Never used  Substance Use Topics   Alcohol use: Yes    Alcohol/week: 2.0 - 3.0 standard drinks of alcohol    Types: 2 - 3 Standard drinks or equivalent per week    Comment: occ   Drug use:  Never   Current Outpatient Medications  Medication Sig Dispense Refill   famotidine (PEPCID) 20 MG tablet Take 20 mg by mouth at bedtime.     loratadine (CLARITIN) 10 MG tablet Take 10 mg by mouth daily as needed for allergies.     omeprazole (PRILOSEC) 20 MG capsule Take 20 mg by mouth daily.     No current facility-administered medications for this visit.   Allergies  Allergen Reactions   Sulfa Antibiotics Other (See Comments)   Review of Systems: All systems reviewed and negative except where noted in HPI.   Physical Exam: BP 122/74   Pulse 79   Ht 5' 6"$  (1.676 m)   Wt 168 lb 6.4 oz (76.4 kg)   LMP 03/16/2015   SpO2 97%   BMI 27.18 kg/m  Constitutional: Pleasant,well-developed, female in no acute distress. HEENT: Normocephalic and atraumatic. Conjunctivae are normal. No scleral icterus. Cardiovascular: Normal rate, regular rhythm.  Pulmonary/chest: Effort normal and breath sounds normal. No wheezing, rales or rhonchi. Abdominal: Soft, nondistended, nontender. Bowel sounds active throughout. There are no masses palpable. No hepatomegaly. Extremities: No edema Neurological: Alert and oriented to person place and time. Skin: Skin is warm and dry. No rashes noted. Psychiatric: Normal mood and affect. Behavior  is normal.  Labs 07/2017: CBC with low Hb of 9.5. CMP with low albumin of 3.3.   MR Pelvis w/contrast 03/05/12: IMPRESSION:  1. Diffuse involvement of the uterus by numerous fibroids, as  detailed above.  Multiple submucosal fibroids noted, but no  intracavitary or pedunculated subserosal fibroids identified.  2.  Normal ovaries.  No adnexal mass or other significant  abnormality identified.   ASSESSMENT AND PLAN: GERD Dysphagia Colon cancer screening Constipation Hemorrhoids, rectal bleeding Patient presents with GERD and hemorrhoids that have been present for years. Currently her regimen of omeprazole 20 mg QD and H2 blocker 20 mg QHS does not completely keep  her reflux under control. I went over conservative strategies to help her GERD and asked her to optimize the timing of her PPI intake. Patient also describes some rectal bleeding due to hemorrhoids, which may be related to some underlying constipation/straining. I went over conservative strategies to help with constipation. Will plan for EGD for further evaluation of her dysphagia and a colonoscopy for colon cancer screening. - GERD handout - Miralax PRN - Drink 8 cups of water, walk 30 min per day, daily fiber supplement - Cont PPI QD and H2 blocker QHS. Asked the patient to take omeprazole 30 min to 1 hr before eating breakfast. - EGD/colonoscopy [facility]. Will have her take Miralax QD for 1 week before her procedures.  Christia Reading, MD  I spent 51 minutes of time, including in depth chart review, independent review of results as outlined above, communicating results with the patient directly, face-to-face time with the patient, coordinating care, ordering studies and medications as appropriate, and documentation.

## 2022-02-26 ENCOUNTER — Other Ambulatory Visit: Payer: BC Managed Care – PPO

## 2022-02-26 DIAGNOSIS — K219 Gastro-esophageal reflux disease without esophagitis: Secondary | ICD-10-CM | POA: Diagnosis not present

## 2022-02-26 DIAGNOSIS — R030 Elevated blood-pressure reading, without diagnosis of hypertension: Secondary | ICD-10-CM | POA: Diagnosis not present

## 2022-02-26 DIAGNOSIS — F338 Other recurrent depressive disorders: Secondary | ICD-10-CM | POA: Diagnosis not present

## 2022-02-26 DIAGNOSIS — Z6825 Body mass index (BMI) 25.0-25.9, adult: Secondary | ICD-10-CM | POA: Diagnosis not present

## 2022-02-26 DIAGNOSIS — Z114 Encounter for screening for human immunodeficiency virus [HIV]: Secondary | ICD-10-CM

## 2022-02-26 DIAGNOSIS — Z1159 Encounter for screening for other viral diseases: Secondary | ICD-10-CM | POA: Diagnosis not present

## 2022-02-27 LAB — COMPLETE METABOLIC PANEL WITH GFR
AG Ratio: 2.3 (calc) (ref 1.0–2.5)
ALT: 20 U/L (ref 6–29)
AST: 15 U/L (ref 10–35)
Albumin: 4.6 g/dL (ref 3.6–5.1)
Alkaline phosphatase (APISO): 80 U/L (ref 37–153)
BUN: 17 mg/dL (ref 7–25)
CO2: 26 mmol/L (ref 20–32)
Calcium: 9 mg/dL (ref 8.6–10.4)
Chloride: 108 mmol/L (ref 98–110)
Creat: 0.81 mg/dL (ref 0.50–1.03)
Globulin: 2 g/dL (calc) (ref 1.9–3.7)
Glucose, Bld: 86 mg/dL (ref 65–99)
Potassium: 4.3 mmol/L (ref 3.5–5.3)
Sodium: 142 mmol/L (ref 135–146)
Total Bilirubin: 0.4 mg/dL (ref 0.2–1.2)
Total Protein: 6.6 g/dL (ref 6.1–8.1)
eGFR: 86 mL/min/{1.73_m2} (ref >=60)

## 2022-02-27 LAB — CBC WITH DIFFERENTIAL/PLATELET
Absolute Monocytes: 231 cells/uL (ref 200–950)
Basophils Absolute: 39 cells/uL (ref 0–200)
Basophils Relative: 1.1 %
Eosinophils Absolute: 60 cells/uL (ref 15–500)
Eosinophils Relative: 1.7 %
HCT: 40.9 % (ref 35.0–45.0)
Hemoglobin: 13.9 g/dL (ref 11.7–15.5)
Lymphs Abs: 1106 cells/uL (ref 850–3900)
MCH: 30 pg (ref 27.0–33.0)
MCHC: 34 g/dL (ref 32.0–36.0)
MCV: 88.1 fL (ref 80.0–100.0)
MPV: 10.7 fL (ref 7.5–12.5)
Monocytes Relative: 6.6 %
Neutro Abs: 2065 cells/uL (ref 1500–7800)
Neutrophils Relative %: 59 %
Platelets: 163 10*3/uL (ref 140–400)
RBC: 4.64 10*6/uL (ref 3.80–5.10)
RDW: 12.4 % (ref 11.0–15.0)
Total Lymphocyte: 31.6 %
WBC: 3.5 10*3/uL — ABNORMAL LOW (ref 3.8–10.8)

## 2022-02-27 LAB — HEPATITIS C ANTIBODY: Hepatitis C Ab: NONREACTIVE

## 2022-02-27 LAB — LIPID PANEL
Cholesterol: 207 mg/dL — ABNORMAL HIGH (ref <200)
HDL: 59 mg/dL (ref >=50)
LDL Cholesterol (Calc): 130 mg/dL (calc) — ABNORMAL HIGH
Non-HDL Cholesterol (Calc): 148 mg/dL (calc) — ABNORMAL HIGH (ref <130)
Total CHOL/HDL Ratio: 3.5 (calc) (ref <5.0)
Triglycerides: 85 mg/dL (ref <150)

## 2022-02-27 LAB — HIV ANTIBODY (ROUTINE TESTING W REFLEX): HIV 1&2 Ab, 4th Generation: NONREACTIVE

## 2022-03-01 ENCOUNTER — Ambulatory Visit (INDEPENDENT_AMBULATORY_CARE_PROVIDER_SITE_OTHER): Payer: BC Managed Care – PPO | Admitting: Orthopedic Surgery

## 2022-03-01 ENCOUNTER — Encounter: Payer: Self-pay | Admitting: Orthopedic Surgery

## 2022-03-01 VITALS — BP 126/80 | HR 87 | Temp 97.9°F | Resp 16 | Ht 66.0 in | Wt 166.0 lb

## 2022-03-01 DIAGNOSIS — K219 Gastro-esophageal reflux disease without esophagitis: Secondary | ICD-10-CM | POA: Diagnosis not present

## 2022-03-01 DIAGNOSIS — Z1211 Encounter for screening for malignant neoplasm of colon: Secondary | ICD-10-CM | POA: Diagnosis not present

## 2022-03-01 DIAGNOSIS — R229 Localized swelling, mass and lump, unspecified: Secondary | ICD-10-CM

## 2022-03-01 DIAGNOSIS — J302 Other seasonal allergic rhinitis: Secondary | ICD-10-CM

## 2022-03-01 DIAGNOSIS — E7800 Pure hypercholesterolemia, unspecified: Secondary | ICD-10-CM

## 2022-03-01 DIAGNOSIS — Z6826 Body mass index (BMI) 26.0-26.9, adult: Secondary | ICD-10-CM

## 2022-03-01 NOTE — Patient Instructions (Addendum)
[facility] [phone]  Consider getting Shingrix at local pharmacy> can cause fatigue do not plan anything after it for 1-2 days  Try Xyzal is allergy medication does not help> take at night  Low fat diet to lower cholesterol> look at [facility]

## 2022-03-01 NOTE — Progress Notes (Signed)
Careteam: Patient Care Team: Yvonna Alanis, NP as PCP - General (Adult Health Nurse Practitioner) Louretta Shorten, MD as Consulting Physician (Obstetrics and Gynecology)  Seen by: Windell Moulding, AGNP-C  PLACE OF SERVICE:  [place] Directive information    Allergies  Allergen Reactions   Sulfa Antibiotics Other (See Comments)    Chief Complaint  Patient presents with   Medical Management of Chronic Issues    6 week follow up.   Health Maintenance    Discuss the need for Colonoscopy, and Mammogram.   Immunizations    Discuss the need for Shingrix vaccine, Influenza vaccine, and Covid Booster. [facility] Verified.     HPI: Patient is a 57 y.o. female seen today for medical management of chronic conditions.   Labs reviewed with patient.   EGD/Colonoscopy scheduled 04/17/2022. GERD improved on famotidine and Prilosec. No more rectal bleeding from hemorrhoids.   Lump on middle back > 3 months. Non tender. Requesting dermatology referral.   Mammogram done at [facility], copy requested.   Weights unchanged. Does not follow structured exercise plan.    Review of Systems:  Review of Systems  Constitutional:  Negative for chills and fever.  HENT:  Positive for congestion. Negative for sore throat.   Eyes:  Negative for redness.  Respiratory:  Negative for cough, sputum production, shortness of breath and wheezing.   Cardiovascular:  Negative for chest pain and leg swelling.  Gastrointestinal:  Positive for blood in stool and heartburn.  Genitourinary:  Negative for dysuria.  Musculoskeletal:  Negative for falls and joint pain.  Skin:  Negative for rash.       Lump on back  Neurological:  Negative for dizziness and headaches.  Psychiatric/Behavioral:  Negative for depression. The patient is not nervous/anxious.     Past Medical History:  Diagnosis Date   Depression    GERD (gastroesophageal reflux disease)    Hemorrhoids    Irregular heart beat    Past  Surgical History:  Procedure Laterality Date   UTERINE FIBROID SURGERY  03/01/2012   Dr. Daron Offer   Social History:   reports that she has never smoked. She has never used smokeless tobacco. She reports current alcohol use of about 2.0 - 3.0 standard drinks of alcohol per week. She reports that she does not use drugs.  Family History  Problem Relation Age of Onset   Cancer Mother        bladder   Diabetes Father    Hypertension Father    Diabetes Maternal Grandmother    Cancer Maternal Grandfather        leukemia   Cancer Paternal Grandmother        throat   Diabetes Paternal Grandmother    Colon cancer Neg Hx    Esophageal cancer Neg Hx    Stomach cancer Neg Hx     Medications: Patient's Medications  New Prescriptions   No medications on file  Previous Medications   FAMOTIDINE (PEPCID) 20 MG TABLET    Take 20 mg by mouth at bedtime.   LORATADINE (CLARITIN) 10 MG TABLET    Take 10 mg by mouth daily as needed for allergies.   NA SULFATE-K SULFATE-MG SULF (SUPREP BOWEL PREP KIT) 17.5-3.13-1.6 GM/177ML SOLN    Take 1 kit by mouth as directed.   OMEPRAZOLE (PRILOSEC) 20 MG CAPSULE    Take 20 mg by mouth daily.  Modified Medications   No medications on file  Discontinued Medications  No medications on file    Physical Exam:  There were no vitals filed for this visit. There is no height or weight on file to calculate BMI. Wt Readings from Last 3 Encounters:  02/20/22 168 lb 6.4 oz (76.4 kg)  01/18/22 160 lb 1 oz (72.6 kg)  04/06/20 161 lb (73 kg)    Physical Exam Vitals reviewed.  Constitutional:      General: She is not in acute distress. HENT:     Head: Normocephalic.  Eyes:     General:        Right eye: No discharge.        Left eye: No discharge.  Cardiovascular:     Rate and Rhythm: Normal rate and regular rhythm.     Pulses: Normal pulses.     Heart sounds: Normal heart sounds.  Pulmonary:     Effort: Pulmonary effort is normal.     Breath sounds:  Normal breath sounds.  Abdominal:     General: Bowel sounds are normal. There is no distension.     Palpations: Abdomen is soft.     Tenderness: There is no abdominal tenderness.  Musculoskeletal:     Cervical back: Neck supple.     Right lower leg: No edema.     Left lower leg: No edema.  Skin:    General: Skin is warm and dry.     Capillary Refill: Capillary refill takes less than 2 seconds.     Comments: Approx 1-2 cm non tender, round mass palpated to middle back, no sign of injury/deformity  Neurological:     General: No focal deficit present.     Mental Status: She is alert and oriented to person, place, and time.  Psychiatric:        Mood and Affect: Mood normal.        Behavior: Behavior normal.     Labs reviewed: Basic Metabolic Panel: Recent Labs    02/26/22 0000  NA 142  K 4.3  CL 108  CO2 26  GLUCOSE 86  BUN 17  CREATININE 0.81  CALCIUM 9.0   Liver Function Tests: Recent Labs    02/26/22 0000  AST 15  ALT 20  BILITOT 0.4  PROT 6.6   No results for input(s): "LIPASE", "AMYLASE" in the last 8760 hours. No results for input(s): "AMMONIA" in the last 8760 hours. CBC: Recent Labs    02/26/22 0000  WBC 3.5*  NEUTROABS 2,065  HGB 13.9  HCT 40.9  MCV 88.1  PLT 163   Lipid Panel: Recent Labs    02/26/22 0000  CHOL 207*  HDL 59  LDLCALC 130*  TRIG 85  CHOLHDL 3.5   TSH: No results for input(s): "TSH" in the last 8760 hours. A1C: No results found for: "HGBA1C"   Assessment/Plan 1. Colon cancer screening - followed by [facility] - scheduled EGD/colonoscopy 04/17/2022  2. Gastroesophageal reflux disease without esophagitis - hgb 13.9 02/26/2022 - stable with famotidine and omeprazole - EGD 04/17/2022  3. Pure hypercholesterolemia - Total 207, LDL 130 - goal LDL < 100 - will try dietary modifications first - discussed low fat diet and avoiding fried foods - lipid panel- future  4. Seasonal allergies - ongoing - may switch to  Xyzal if worsens - cont Claritin  5. Adult BMI 26.0-26.9 kg/sq m - recommend exercise 150 min/week - recommend calories counting < 1500/day  6. Localized skin mass, lump, or swelling - approx 1-2 cm non tender mass to middle  back  - unknown time frame - ? Lipoma - recommend ultrasound in future if symptoms develop - recommend dermatology referral  Total time: 33 minutes. Greater than 50% of total time spent doing patient education regarding heath maintenance, elevated cholesterol, GERD, exercise including symptom/medication management.    Next appt: 08/23/2022  Windell Moulding, [facility] [phone]

## 2022-03-07 DIAGNOSIS — D225 Melanocytic nevi of trunk: Secondary | ICD-10-CM | POA: Diagnosis not present

## 2022-03-07 DIAGNOSIS — L820 Inflamed seborrheic keratosis: Secondary | ICD-10-CM | POA: Diagnosis not present

## 2022-03-07 DIAGNOSIS — D485 Neoplasm of uncertain behavior of skin: Secondary | ICD-10-CM | POA: Diagnosis not present

## 2022-03-07 DIAGNOSIS — D2262 Melanocytic nevi of left upper limb, including shoulder: Secondary | ICD-10-CM | POA: Diagnosis not present

## 2022-03-07 DIAGNOSIS — D692 Other nonthrombocytopenic purpura: Secondary | ICD-10-CM | POA: Diagnosis not present

## 2022-04-06 ENCOUNTER — Encounter: Payer: Self-pay | Admitting: Internal Medicine

## 2022-04-11 ENCOUNTER — Ambulatory Visit (INDEPENDENT_AMBULATORY_CARE_PROVIDER_SITE_OTHER): Payer: BC Managed Care – PPO | Admitting: Plastic Surgery

## 2022-04-11 ENCOUNTER — Encounter: Payer: Self-pay | Admitting: Plastic Surgery

## 2022-04-11 VITALS — BP 159/85 | HR 82 | Ht 65.0 in | Wt 167.6 lb

## 2022-04-11 DIAGNOSIS — R222 Localized swelling, mass and lump, trunk: Secondary | ICD-10-CM | POA: Diagnosis not present

## 2022-04-11 DIAGNOSIS — D489 Neoplasm of uncertain behavior, unspecified: Secondary | ICD-10-CM

## 2022-04-11 NOTE — Progress Notes (Signed)
Referring Provider Octavia Heir, NP [phone] [address] [place],  [place] [address]   CC:  Chief Complaint  Patient presents with   Advice Only      Julie Hanson is an 57 y.o. female.  HPI: Julie Hanson is a 57 year old female who presents today for evaluation of a soft tissue mass at approximately the level of L1-L2 overlying the left paraspinous muscles.  Patient states that this has been there for quite some time but seems to be getting larger.  She said it is not consistently painful but it is uncomfortable if she lays on it or occasionally feels like it catches on her lower ribs.  She has no other associated symptoms and denies any other medical issues.  Allergies  Allergen Reactions   Sulfa Antibiotics Other (See Comments)    Outpatient Encounter Medications as of 04/11/2022  Medication Sig   famotidine (PEPCID) 20 MG tablet Take 20 mg by mouth at bedtime.   loratadine (CLARITIN) 10 MG tablet Take 10 mg by mouth daily as needed for allergies.   Na Sulfate-K Sulfate-Mg Sulf (SUPREP BOWEL PREP KIT) 17.5-3.13-1.6 GM/177ML SOLN Take 1 kit by mouth as directed.   omeprazole (PRILOSEC) 20 MG capsule Take 20 mg by mouth daily.   No facility-administered encounter medications on file as of 04/11/2022.     Past Medical History:  Diagnosis Date   Depression    GERD (gastroesophageal reflux disease)    Hemorrhoids    Irregular heart beat     Past Surgical History:  Procedure Laterality Date   UTERINE FIBROID SURGERY  03/01/2012   Dr. Valentina Shaggy    Family History  Problem Relation Age of Onset   Cancer Mother        bladder   Diabetes Father    Hypertension Father    Diabetes Maternal Grandmother    Cancer Maternal Grandfather        leukemia   Cancer Paternal Grandmother        throat   Diabetes Paternal Grandmother    Colon cancer Neg Hx    Esophageal cancer Neg Hx    Stomach cancer Neg Hx     Social History   Social History Narrative   Not on file     Review  of Systems General: Denies fevers, chills, weight loss CV: Denies chest pain, shortness of breath, palpitations Soft tissue: Indistinct mass over the left paraspinous muscles  Physical Exam    04/11/2022   11:36 AM 03/01/2022    9:26 AM 02/20/2022    8:27 AM  Vitals with BMI  Height 5\' 5"  5\' 6"  5\' 6"   Weight 167 lbs 10 oz 166 lbs 168 lbs 6 oz  BMI 27.89 26.81 27.19  Systolic 159 126 449  Diastolic 85 80 74  Pulse 82 87 79    General:  No acute distress,  Alert and oriented, Non-Toxic, Normal speech and affect Soft tissue: Patient has a very indistinct mass overlying the left paraspinous muscles at about the L1-L2 region.  The medial portion of the mass is more well-defined however the lateral portion is very ill-defined.  She has no pain with palpation. Mammogram: Not applicable Assessment/Plan Soft tissue mass: Patient has a soft tissue mass overlying the left paraspinous muscles.  Is unclear whether this is associated with the spine and whether it is actually in the muscle.  Will obtain imaging and have the patient return after the study is complete.  Julie Hanson 04/11/2022,  11:44 AM

## 2022-04-16 ENCOUNTER — Telehealth: Payer: Self-pay | Admitting: Internal Medicine

## 2022-04-16 NOTE — Telephone Encounter (Signed)
Returned the patient's phone call regarding her Prep for tomorrow. She was concered that the prep contained Sulfur. Confirmed the list of ingredients with her as it does not contain this. PT verbalized understanding.

## 2022-04-16 NOTE — Telephone Encounter (Signed)
Inbound call from patient, states she is allergic to sulfur and prep medication given has sulfur in it. Patient has a procedure tomorrow at 2:00 PM and would like to know if there are any alternatives to prep given.

## 2022-04-17 ENCOUNTER — Ambulatory Visit (AMBULATORY_SURGERY_CENTER): Payer: BC Managed Care – PPO | Admitting: Internal Medicine

## 2022-04-17 ENCOUNTER — Encounter: Payer: Self-pay | Admitting: Internal Medicine

## 2022-04-17 VITALS — BP 123/79 | HR 74 | Temp 99.5°F | Resp 13 | Ht 65.0 in | Wt 167.0 lb

## 2022-04-17 DIAGNOSIS — K317 Polyp of stomach and duodenum: Secondary | ICD-10-CM | POA: Diagnosis not present

## 2022-04-17 DIAGNOSIS — K635 Polyp of colon: Secondary | ICD-10-CM | POA: Diagnosis not present

## 2022-04-17 DIAGNOSIS — D122 Benign neoplasm of ascending colon: Secondary | ICD-10-CM

## 2022-04-17 DIAGNOSIS — K2950 Unspecified chronic gastritis without bleeding: Secondary | ICD-10-CM | POA: Diagnosis not present

## 2022-04-17 DIAGNOSIS — K5900 Constipation, unspecified: Secondary | ICD-10-CM

## 2022-04-17 DIAGNOSIS — R1310 Dysphagia, unspecified: Secondary | ICD-10-CM | POA: Diagnosis not present

## 2022-04-17 DIAGNOSIS — Z1211 Encounter for screening for malignant neoplasm of colon: Secondary | ICD-10-CM | POA: Diagnosis not present

## 2022-04-17 DIAGNOSIS — D125 Benign neoplasm of sigmoid colon: Secondary | ICD-10-CM

## 2022-04-17 DIAGNOSIS — K2100 Gastro-esophageal reflux disease with esophagitis, without bleeding: Secondary | ICD-10-CM | POA: Diagnosis not present

## 2022-04-17 DIAGNOSIS — K2980 Duodenitis without bleeding: Secondary | ICD-10-CM | POA: Diagnosis not present

## 2022-04-17 DIAGNOSIS — K2970 Gastritis, unspecified, without bleeding: Secondary | ICD-10-CM

## 2022-04-17 DIAGNOSIS — K219 Gastro-esophageal reflux disease without esophagitis: Secondary | ICD-10-CM

## 2022-04-17 MED ORDER — SODIUM CHLORIDE 0.9 % IV SOLN: 500.0000 mL | INTRAVENOUS | Status: DC

## 2022-04-17 MED ORDER — OMEPRAZOLE 40 MG PO CPDR: 40.0000 mg | DELAYED_RELEASE_CAPSULE | Freq: Two times a day (BID) | ORAL | 0 refills | Status: DC

## 2022-04-17 NOTE — Patient Instructions (Addendum)
Handouts Provided:  Gastritis and Esophagitis  Please see scheduled EGD as discussed- instructions provided.  INCREASE omeprazole to 40 mg twice daily for 10 weeks.  Medication has been sent to your pharmacy.   YOU HAD AN ENDOSCOPIC PROCEDURE TODAY AT THE [facility]:   Refer to the procedure report that was given to you for any specific questions about what was found during the examination.  If the procedure report does not answer your questions, please call your gastroenterologist to clarify.  If you requested that your care partner not be given the details of your procedure findings, then the procedure report has been included in a sealed envelope for you to review at your convenience later.  YOU SHOULD EXPECT: Some feelings of bloating in the abdomen. Passage of more gas than usual.  Walking can help get rid of the air that was put into your GI tract during the procedure and reduce the bloating. If you had a lower endoscopy (such as a colonoscopy or flexible sigmoidoscopy) you may notice spotting of blood in your stool or on the toilet paper. If you underwent a bowel prep for your procedure, you may not have a normal bowel movement for a few days.  Please Note:  You might notice some irritation and congestion in your nose or some drainage.  This is from the oxygen used during your procedure.  There is no need for concern and it should clear up in a day or so.  SYMPTOMS TO REPORT IMMEDIATELY:  Following lower endoscopy (colonoscopy or flexible sigmoidoscopy):  Excessive amounts of blood in the stool  Significant tenderness or worsening of abdominal pains  Swelling of the abdomen that is new, acute  Fever of 100F or higher  Following upper endoscopy (EGD)  Vomiting of blood or coffee ground material  New chest pain or pain under the shoulder blades  Painful or persistently difficult swallowing  New shortness of breath  Fever of 100F or higher  Black, tarry-looking  stools  For urgent or emergent issues, a gastroenterologist can be reached at any hour by calling [phone]. Do not use MyChart messaging for urgent concerns.    DIET:  We do recommend a small meal at first, but then you may proceed to your regular diet.  Drink plenty of fluids but you should avoid alcoholic beverages for 24 hours.  ACTIVITY:  You should plan to take it easy for the rest of today and you should NOT DRIVE or use heavy machinery until tomorrow (because of the sedation medicines used during the test).    FOLLOW UP: Our staff will call the number listed on your records the next business day following your procedure.  We will call around 7:15- 8:00 am to check on you and address any questions or concerns that you may have regarding the information given to you following your procedure. If we do not reach you, we will leave a message.     If any biopsies were taken you will be contacted by phone or by letter within the next 1-3 weeks.  Please call us at [phone] if you have not heard about the biopsies in 3 weeks.    SIGNATURES/CONFIDENTIALITY: You and/or your care partner have signed paperwork which will be entered into your electronic medical record.  These signatures attest to the fact that that the information above on your After Visit Summary has been reviewed and is understood.  Full responsibility of the confidentiality of this discharge information lies  with you and/or your care-partner.

## 2022-04-17 NOTE — Op Note (Signed)
[facility] Patient Name: Julie Hanson Procedure Date: 04/17/2022 1:50 PM MRN: [id number] Endoscopist: Particia Lather , , [id number] Age: 57 Referring MD:  Date of Birth: [birth year] Gender: Female Account #: [id number] Procedure:                Colonoscopy Indications:              Screening for colorectal malignant neoplasm Medicines:                Monitored Anesthesia Care Procedure:                Pre-Anesthesia Assessment:                           - Prior to the procedure, a History and Physical                            was performed, and patient medications and                            allergies were reviewed. The patient's tolerance of                            previous anesthesia was also reviewed. The risks                            and benefits of the procedure and the sedation                            options and risks were discussed with the patient.                            All questions were answered, and informed consent                            was obtained. Prior Anticoagulants: The patient has                            taken no anticoagulant or antiplatelet agents. ASA                            Grade Assessment: II - A patient with mild systemic                            disease. After reviewing the risks and benefits,                            the patient was deemed in satisfactory condition to                            undergo the procedure.                           After obtaining informed consent, the colonoscope  was passed under direct vision. Throughout the                            procedure, the patient's blood pressure, pulse, and                            oxygen saturations were monitored continuously. The                            Olympus PCF-H190DL ([id number]) Colonoscope was                            introduced through the anus and advanced to the the                            cecum,  identified by appendiceal orifice and                            ileocecal valve. The colonoscopy was performed                            without difficulty. The patient tolerated the                            procedure well. The quality of the bowel                            preparation was good. The ileocecal valve,                            appendiceal orifice, and rectum were photographed. Scope In: 2:12:04 PM Scope Out: 2:34:58 PM Scope Withdrawal Time: 0 hours 14 minutes 12 seconds  Total Procedure Duration: 0 hours 22 minutes 54 seconds  Findings:                 Two sessile polyps were found in the sigmoid colon                            and ascending colon. The polyps were 3 to 5 mm in                            size. These polyps were removed with a cold snare.                            Resection and retrieval were complete.                           Non-bleeding internal hemorrhoids were found during                            retroflexion. Complications:            No immediate complications. Estimated Blood Loss:     Estimated blood loss was minimal. Impression:               -  Two 3 to 5 mm polyps in the sigmoid colon and in                            the ascending colon, removed with a cold snare.                            Resected and retrieved.                           - Non-bleeding internal hemorrhoids. Recommendation:           - Discharge patient to home (with escort).                           - Await pathology results.                           - The findings and recommendations were discussed                            with the patient. Dr Particia Lather "Alan Ripper" Leonides Schanz,  04/17/2022 2:48:31 PM

## 2022-04-17 NOTE — Progress Notes (Signed)
Called to room to assist during endoscopic procedure.  Patient ID and intended procedure confirmed with present staff. Received instructions for my participation in the procedure from the performing physician.  

## 2022-04-17 NOTE — Progress Notes (Signed)
Vss nad trans to pacu 

## 2022-04-17 NOTE — Progress Notes (Signed)
GASTROENTEROLOGY PROCEDURE H&P NOTE   Primary Care Physician: Octavia Heir, NP    Reason for Procedure:   Dysphagia, GERD, colon cancer screening  Plan:    EGD/colonoscopy  Patient is appropriate for endoscopic procedure(s) in the ambulatory ([facility]) setting.  The nature of the procedure, as well as the risks, benefits, and alternatives were carefully and thoroughly reviewed with the patient. Ample time for discussion and questions allowed. The patient understood, was satisfied, and agreed to proceed.     HPI: Julie Hanson is a 57 y.o. female who presents for EGD/colonoscopy for evaluation of dysphagia, GERD, and colon cancer screening .  Patient was most recently seen in the Gastroenterology Clinic on 02/20/22.  No interval change in medical history since that appointment. Please refer to that note for full details regarding GI history and clinical presentation.   Past Medical History:  Diagnosis Date   Anemia    with fibroids   Blood transfusion without reported diagnosis    Depression    GERD (gastroesophageal reflux disease)    Hemorrhoids    Irregular heart beat     Past Surgical History:  Procedure Laterality Date   UTERINE FIBROID SURGERY  03/01/2012   Dr. Valentina Shaggy    Prior to Admission medications   Medication Sig Start Date End Date Taking? Authorizing Provider  famotidine (PEPCID) 20 MG tablet Take 20 mg by mouth at bedtime.    [provider]  loratadine (CLARITIN) 10 MG tablet Take 10 mg by mouth daily as needed for allergies.    [provider]  Na Sulfate-K Sulfate-Mg Sulf (SUPREP BOWEL PREP KIT) 17.5-3.13-1.6 GM/177ML SOLN Take 1 kit by mouth as directed. 02/20/22   Imogene Burn, MD  omeprazole (PRILOSEC) 20 MG capsule Take 20 mg by mouth daily.    [provider]    Current Outpatient Medications  Medication Sig Dispense Refill   famotidine (PEPCID) 20 MG tablet Take 20 mg by mouth at bedtime.     loratadine (CLARITIN) 10  MG tablet Take 10 mg by mouth daily as needed for allergies.     omeprazole (PRILOSEC) 20 MG capsule Take 20 mg by mouth daily.     Current Facility-Administered Medications  Medication Dose Route Frequency Provider Last Rate Last Admin   0.9 %  sodium chloride infusion  500 mL Intravenous Continuous Imogene Burn, MD        Allergies as of 04/17/2022 - Review Complete 04/17/2022  Allergen Reaction Noted   Sulfa antibiotics Other (See Comments) 07/02/2017    Family History  Problem Relation Age of Onset   Cancer Mother        bladder   Diabetes Father    Hypertension Father    Diabetes Maternal Grandmother    Cancer Maternal Grandfather        leukemia   Cancer Paternal Grandmother        throat   Diabetes Paternal Grandmother    Colon cancer Neg Hx    Esophageal cancer Neg Hx    Stomach cancer Neg Hx     Social History   Socioeconomic History   Marital status: Single    Spouse name: Not on file   Number of children: Not on file   Years of education: Not on file   Highest education level: Not on file  Occupational History   Not on file  Tobacco Use   Smoking status: Never   Smokeless tobacco: Never  Vaping Use  Vaping Use: Never used  Substance and Sexual Activity   Alcohol use: Yes    Alcohol/week: 2.0 - 3.0 standard drinks of alcohol    Types: 2 - 3 Standard drinks or equivalent per week    Comment: occ   Drug use: Never   Sexual activity: Yes    Partners: Male    Comment: 1st intercourse- 89, partners- 5+, current partner- 12 yrs   Other Topics Concern   Not on file  Social History Narrative   Not on file   Social Determinants of Health   Financial Resource Strain: Not on file  Food Insecurity: Not on file  Transportation Needs: Not on file  Physical Activity: Not on file  Stress: Not on file  Social Connections: Not on file  Intimate Partner Violence: Not on file    Physical Exam: Vital signs in last 24 hours: BP 130/78   Pulse 86   Temp  99.5 F (37.5 C)   Ht  (1.651 m)   Wt 167 lb (75.8 kg)   LMP 03/16/2015   SpO2 97%   BMI 27.79 kg/m  GEN: NAD EYE: Sclerae anicteric ENT: MMM CV: Non-tachycardic Pulm: No increased WOB GI: Soft NEURO:  Alert & Oriented   Eulah Pont, MD [facility]   04/17/2022 1:43 PM'

## 2022-04-17 NOTE — Op Note (Signed)
[facility] Patient Name: Julie Hanson Procedure Date: 04/17/2022 1:51 PM MRN: [id number] Endoscopist: Particia Lather , , [id number] Age: 57 Referring MD:  Date of Birth: 02-24-1965 Gender: Female Account #: [id number] Procedure:                Upper GI endoscopy Indications:              Dysphagia Medicines:                Monitored Anesthesia Care Procedure:                Pre-Anesthesia Assessment:                           - Prior to the procedure, a History and Physical                            was performed, and patient medications and                            allergies were reviewed. The patient's tolerance of                            previous anesthesia was also reviewed. The risks                            and benefits of the procedure and the sedation                            options and risks were discussed with the patient.                            All questions were answered, and informed consent                            was obtained. Prior Anticoagulants: The patient has                            taken no anticoagulant or antiplatelet agents. ASA                            Grade Assessment: II - A patient with mild systemic                            disease. After reviewing the risks and benefits,                            the patient was deemed in satisfactory condition to                            undergo the procedure.                           After obtaining informed consent, the endoscope was  passed under direct vision. Throughout the                            procedure, the patient's blood pressure, pulse, and                            oxygen saturations were monitored continuously. The                            GIF [id number] #[id number] was introduced through the                            mouth, and advanced to the second part of duodenum.                            The upper GI endoscopy was accomplished  without                            difficulty. The patient tolerated the procedure                            well. Scope In: Scope Out: Findings:                 LA Grade C (one or more mucosal breaks continuous                            between tops of 2 or more mucosal folds, less than                            75% circumference) esophagitis with no bleeding was                            found in the distal esophagus. Biopsies were taken                            with a cold forceps for histology.                           A hiatal hernia was present.                           Clear fluid with some scattered coffee grounds was                            found in the gastric body.                           Four 4 to 10 mm sessile polyps with no bleeding and                            no stigmata of recent bleeding were found in the  gastric body. These polyps were removed with a cold                            snare. Resection and retrieval were complete.                           Localized erythematous mucosa without bleeding was                            found in the gastric antrum. Biopsies were taken                            with a cold forceps for histology.                           Localized mild mucosal variance characterized by                            altered texture was found in the second portion of                            the duodenum. Biopsies were taken with a cold                            forceps for histology. Complications:            No immediate complications. Estimated Blood Loss:     Estimated blood loss was minimal. Impression:               - LA Grade C reflux esophagitis with no bleeding.                            Biopsied.                           - Hiatal hernia.                           - Clear gastric fluid with scattered coffee                            grounds..                           - Four gastric polyps.  Resected and retrieved.                           - Erythematous mucosa in the antrum. Biopsied.                           - Mucosal variant in the duodenum. Biopsied. Recommendation:           - Await pathology results.                           - Use Prilosec (omeprazole) 40 mg PO BID for 10  weeks.                           - Repeat upper endoscopy in 8 weeks to check                            healing.                           - Perform a colonoscopy today. Dr Particia Lather "Alan Ripper" Leonides Schanz,  04/17/2022 2:46:37 PM

## 2022-04-18 ENCOUNTER — Telehealth: Payer: Self-pay | Admitting: *Deleted

## 2022-04-18 NOTE — Telephone Encounter (Signed)
Post procedure follow up phone call. No answer at number given.  Left message on voicemail.  

## 2022-04-24 ENCOUNTER — Encounter: Payer: Self-pay | Admitting: Internal Medicine

## 2022-05-03 ENCOUNTER — Telehealth: Payer: Self-pay | Admitting: Internal Medicine

## 2022-05-03 NOTE — Telephone Encounter (Signed)
Patient called regarding path results would like for a physician to review since Dr. Leonides Schanz is out.

## 2022-05-03 NOTE — Telephone Encounter (Signed)
  Left message on machine to call back    04/24/2022  [facility]          [address] [place] [address]          Dear Ms. Keelan,  I am writing to inform you that the biopsies taken during your recent endoscopic examination showed changes in the esophagus due to acid reflux and inflammation in the stomach and small bowel. Biopsies were negative for infection or autoimmune disease. 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 To determine the extent of your gastric intestinal metaplasia, I would recommend a repeat upper endoscopy in 1 year. Your stomach polyps were benign.  Please call us at [phone] if you have persistent problems or have questions about your condition that have not been fully answered at this time.  Sincerely,  Imogene Burn, MD        04/24/2022  Julie Hanson [address]          [address] [place] [address]             Dear Ms. Willemsen,  One of the polyps removed from your colon was benign, but precancerous. This means that it had the potential to change into cancer over time had it not been removed.  I recommend you have a repeat colonoscopy in 7 years to determine if you have developed any new polyps and to screen for colorectal cancer.  If you develop any new rectal bleeding, abdominal pain or significant bowel habit changes, please contact our office at [phone] .  Please call us if you have persistent problems or have questions about your condition that have not been fully answered at this time.  Sincerely,   Imogene Burn, MD

## 2022-05-04 NOTE — Telephone Encounter (Signed)
The pt has been advised of the letters with path results.  I did read those to her and she will await to receive them in the mail

## 2022-05-07 ENCOUNTER — Ambulatory Visit
Admission: RE | Admit: 2022-05-07 | Discharge: 2022-05-07 | Disposition: A | Discharge status: Home / Self Care | Payer: BC Managed Care – PPO | Source: Ambulatory Visit | Attending: Plastic Surgery | Admitting: Plastic Surgery

## 2022-05-07 DIAGNOSIS — D489 Neoplasm of uncertain behavior, unspecified: Secondary | ICD-10-CM

## 2022-05-07 DIAGNOSIS — C419 Malignant neoplasm of bone and articular cartilage, unspecified: Secondary | ICD-10-CM | POA: Diagnosis not present

## 2022-05-09 ENCOUNTER — Other Ambulatory Visit: Payer: Self-pay | Admitting: Internal Medicine

## 2022-05-11 ENCOUNTER — Other Ambulatory Visit: Payer: Self-pay

## 2022-06-01 ENCOUNTER — Encounter: Payer: Self-pay | Admitting: Internal Medicine

## 2022-06-08 ENCOUNTER — Other Ambulatory Visit: Payer: Self-pay

## 2022-06-08 ENCOUNTER — Other Ambulatory Visit: Payer: Self-pay | Admitting: Internal Medicine

## 2022-06-12 ENCOUNTER — Encounter: Payer: Self-pay | Admitting: Internal Medicine

## 2022-06-12 ENCOUNTER — Ambulatory Visit (AMBULATORY_SURGERY_CENTER): Payer: BC Managed Care – PPO | Admitting: Internal Medicine

## 2022-06-12 VITALS — BP 134/84 | HR 70 | Temp 97.7°F | Resp 13 | Ht 66.0 in | Wt 167.4 lb

## 2022-06-12 DIAGNOSIS — K3189 Other diseases of stomach and duodenum: Secondary | ICD-10-CM | POA: Diagnosis not present

## 2022-06-12 DIAGNOSIS — K219 Gastro-esophageal reflux disease without esophagitis: Secondary | ICD-10-CM

## 2022-06-12 DIAGNOSIS — R1310 Dysphagia, unspecified: Secondary | ICD-10-CM

## 2022-06-12 DIAGNOSIS — K317 Polyp of stomach and duodenum: Secondary | ICD-10-CM

## 2022-06-12 DIAGNOSIS — K319 Disease of stomach and duodenum, unspecified: Secondary | ICD-10-CM | POA: Diagnosis not present

## 2022-06-12 MED ORDER — SODIUM CHLORIDE 0.9 % IV SOLN
500.0000 mL | Freq: Once | INTRAVENOUS | Status: DC
Start: 2022-06-12 — End: 2022-06-12

## 2022-06-12 NOTE — Progress Notes (Signed)
Called to room to assist during endoscopic procedure.  Patient ID and intended procedure confirmed with present staff. Received instructions for my participation in the procedure from the performing physician.  

## 2022-06-12 NOTE — Progress Notes (Signed)
GASTROENTEROLOGY PROCEDURE H&P NOTE   Primary Care Physician: Octavia Heir, NP    Reason for Procedure:   History of esophagitis, GIM  Plan:    EGD  Patient is appropriate for endoscopic procedure(s) in the ambulatory ([facility]) setting.  The nature of the procedure, as well as the risks, benefits, and alternatives were carefully and thoroughly reviewed with the patient. Ample time for discussion and questions allowed. The patient understood, was satisfied, and agreed to proceed.     HPI: Julie Hanson is a 57 y.o. female who presents for EGD for history of esophagitis and GIM. Her reflux control has improved significantly on omeprazole 40 mg BID. She has been sleeping better than she did in the past.   Past Medical History:  Diagnosis Date   Anemia    with fibroids   Blood transfusion without reported diagnosis    Depression    GERD (gastroesophageal reflux disease)    Hemorrhoids    Irregular heart beat     Past Surgical History:  Procedure Laterality Date   UTERINE FIBROID SURGERY  03/01/2012   Dr. Valentina Shaggy    Prior to Admission medications   Medication Sig Start Date End Date Taking? Authorizing Provider  omeprazole (PRILOSEC) 40 MG capsule TAKE 1 CAPSULE (40 MG TOTAL) BY MOUTH 2 (TWO) TIMES DAILY. TAKE FOR 10 WEEKS 06/08/22  Yes Imogene Burn, MD  famotidine (PEPCID) 20 MG tablet Take 20 mg by mouth at bedtime.    [provider]  loratadine (CLARITIN) 10 MG tablet Take 10 mg by mouth daily as needed for allergies.    [provider]    Current Outpatient Medications  Medication Sig Dispense Refill   omeprazole (PRILOSEC) 40 MG capsule TAKE 1 CAPSULE (40 MG TOTAL) BY MOUTH 2 (TWO) TIMES DAILY. TAKE FOR 10 WEEKS 180 capsule 1   famotidine (PEPCID) 20 MG tablet Take 20 mg by mouth at bedtime.     loratadine (CLARITIN) 10 MG tablet Take 10 mg by mouth daily as needed for allergies.     Current Facility-Administered Medications  Medication Dose  Route Frequency Provider Last Rate Last Admin   0.9 %  sodium chloride infusion  500 mL Intravenous Once Imogene Burn, MD        Allergies as of 06/12/2022 - Review Complete 06/12/2022  Allergen Reaction Noted   Sulfa antibiotics Other (See Comments) 07/02/2017    Family History  Problem Relation Age of Onset   Cancer Mother        bladder   Diabetes Father    Hypertension Father    Diabetes Maternal Grandmother    Cancer Maternal Grandfather        leukemia   Cancer Paternal Grandmother        throat   Diabetes Paternal Grandmother    Colon cancer Neg Hx    Esophageal cancer Neg Hx    Stomach cancer Neg Hx     Social History   Socioeconomic History   Marital status: Single    Spouse name: Not on file   Number of children: Not on file   Years of education: Not on file   Highest education level: Not on file  Occupational History   Not on file  Tobacco Use   Smoking status: Never   Smokeless tobacco: Never  Vaping Use   Vaping Use: Never used  Substance and Sexual Activity   Alcohol use: Yes    Alcohol/week: 2.0 - 3.0 standard  drinks of alcohol    Types: 2 - 3 Standard drinks or equivalent per week    Comment: occ   Drug use: Never   Sexual activity: Yes    Partners: Male    Comment: 1st intercourse- 59, partners- 5+, current partner- 12 yrs   Other Topics Concern   Not on file  Social History Narrative   Not on file   Social Determinants of Health   Financial Resource Strain: Not on file  Food Insecurity: Not on file  Transportation Needs: Not on file  Physical Activity: Not on file  Stress: Not on file  Social Connections: Not on file  Intimate Partner Violence: Not on file    Physical Exam: Vital signs in last 24 hours: BP (!) 130/92   Pulse 79   Temp 97.7 F (36.5 C)   Resp 15   Ht 5\' 6"  (1.676 m)   Wt 167 lb 6.4 oz (75.9 kg)   LMP 03/16/2015   SpO2 98%   BMI 27.02 kg/m  GEN: NAD EYE: Sclerae anicteric ENT: MMM CV:  Non-tachycardic Pulm: No increased work of breathing GI: Soft, NT/ND NEURO:  Alert & Oriented   Eulah Pont, MD [facility]  06/12/2022 1:32 PM

## 2022-06-12 NOTE — Op Note (Signed)
[facility] Patient Name: Julie Hanson Procedure Date: 06/12/2022 1:27 PM MRN: [id number] Endoscopist: Particia Lather , , [id number] Age: 57 Referring MD:  Date of Birth: 24-Dec-1965 Gender: Female Account #: [id number] Procedure:                Upper GI endoscopy Indications:              Follow-up of esophagitis, Follow-up of intestinal                            metaplasia Medicines:                Monitored Anesthesia Care Procedure:                Pre-Anesthesia Assessment:                           - Prior to the procedure, a History and Physical                            was performed, and patient medications and                            allergies were reviewed. The patient's tolerance of                            previous anesthesia was also reviewed. The risks                            and benefits of the procedure and the sedation                            options and risks were discussed with the patient.                            All questions were answered, and informed consent                            was obtained. Prior Anticoagulants: The patient has                            taken no anticoagulant or antiplatelet agents. ASA                            Grade Assessment: II - A patient with mild systemic                            disease. After reviewing the risks and benefits,                            the patient was deemed in satisfactory condition to                            undergo the procedure.  After obtaining informed consent, the endoscope was                            passed under direct vision. Throughout the                            procedure, the patient's blood pressure, pulse, and                            oxygen saturations were monitored continuously. The                            GIF [id number] #[id number] was introduced through the                            mouth, and advanced to the second part  of duodenum.                            The upper GI endoscopy was accomplished without                            difficulty. The patient tolerated the procedure                            well. Scope In: Scope Out: Findings:                 The examined esophagus was normal.                           A large hiatal hernia was present.                           Patchy mildly erythematous mucosa without bleeding                            was found in the gastric body and in the gastric                            antrum. Biopsies were taken with a cold forceps for                            histology for gastric mapping.                           Multiple sessile polyps with no bleeding and no                            stigmata of recent bleeding were found in the                            gastric body.                           The examined duodenum was normal. Complications:  No immediate complications. Estimated Blood Loss:     Estimated blood loss was minimal. Impression:               - Normal esophagus.                           - Large hiatal hernia.                           - Erythematous mucosa in the gastric body and                            antrum. Biopsied for gastric mapping.                           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                           - Normal examined duodenum. Recommendation:           - Discharge patient to home (with escort).                           - Await pathology results.                           - Continue PPI BID for now.                           - Return to GI clinic in 3 months.                           - The findings and recommendations were discussed                            with the patient. Dr Particia Lather "Alan Ripper" Leonides Schanz,  06/12/2022 1:51:46 PM

## 2022-06-12 NOTE — Progress Notes (Signed)
A and O x3. Report to RN. Tolerated MAC anesthesia well.Teeth unchanged after procedure. 

## 2022-06-12 NOTE — Patient Instructions (Addendum)
Resume previous diet Continue present medications, including twice daily PPI (Omeprazole) Office visit with Dr Leonides Schanz, Tuesday 10/02/22 210 PM Await pathology results Handouts/information given for hiatal hernia, GERD and gastric polyps  YOU HAD AN ENDOSCOPIC PROCEDURE TODAY AT THE [facility]:   Refer to the procedure report that was given to you for any specific questions about what was found during the examination.  If the procedure report does not answer your questions, please call your gastroenterologist to clarify.  If you requested that your care partner not be given the details of your procedure findings, then the procedure report has been included in a sealed envelope for you to review at your convenience later.  YOU SHOULD EXPECT: Some feelings of bloating in the abdomen. Passage of more gas than usual.  Walking can help get rid of the air that was put into your GI tract during the procedure and reduce the bloating. If you had a lower endoscopy (such as a colonoscopy or flexible sigmoidoscopy) you may notice spotting of blood in your stool or on the toilet paper. If you underwent a bowel prep for your procedure, you may not have a normal bowel movement for a few days.  Please Note:  You might notice some irritation and congestion in your nose or some drainage.  This is from the oxygen used during your procedure.  There is no need for concern and it should clear up in a day or so.  SYMPTOMS TO REPORT IMMEDIATELY:  Following upper endoscopy (EGD)  Vomiting of blood or coffee ground material  New chest pain or pain under the shoulder blades  Painful or persistently difficult swallowing  New shortness of breath  Fever of 100F or higher  Black, tarry-looking stools  For urgent or emergent issues, a gastroenterologist can be reached at any hour by calling [phone]. Do not use MyChart messaging for urgent concerns.    DIET:  We do recommend a small meal at first, but  then you may proceed to your regular diet.  Drink plenty of fluids but you should avoid alcoholic beverages for 24 hours.  ACTIVITY:  You should plan to take it easy for the rest of today and you should NOT DRIVE or use heavy machinery until tomorrow (because of the sedation medicines used during the test).    FOLLOW UP: Our staff will call the number listed on your records the next business day following your procedure.  We will call around 7:15- 8:00 am to check on you and address any questions or concerns that you may have regarding the information given to you following your procedure. If we do not reach you, we will leave a message.     If any biopsies were taken you will be contacted by phone or by letter within the next 1-3 weeks.  Please call us at [phone] if you have not heard about the biopsies in 3 weeks.    SIGNATURES/CONFIDENTIALITY: You and/or your care partner have signed paperwork which will be entered into your electronic medical record.  These signatures attest to the fact that that the information above on your After Visit Summary has been reviewed and is understood.  Full responsibility of the confidentiality of this discharge information lies with you and/or your care-partner.

## 2022-06-12 NOTE — Progress Notes (Signed)
Pt's states no medical or surgical changes since previsit or office visit. 

## 2022-06-13 ENCOUNTER — Telehealth: Payer: Self-pay

## 2022-06-13 NOTE — Telephone Encounter (Signed)
No answer.  Left HIPAA compliant voicemail. 

## 2022-06-18 ENCOUNTER — Encounter: Payer: Self-pay | Admitting: Internal Medicine

## 2022-06-20 DIAGNOSIS — M25511 Pain in right shoulder: Secondary | ICD-10-CM | POA: Diagnosis not present

## 2022-06-28 DIAGNOSIS — S42251D Displaced fracture of greater tuberosity of right humerus, subsequent encounter for fracture with routine healing: Secondary | ICD-10-CM | POA: Diagnosis not present

## 2022-07-30 DIAGNOSIS — S42251D Displaced fracture of greater tuberosity of right humerus, subsequent encounter for fracture with routine healing: Secondary | ICD-10-CM | POA: Diagnosis not present

## 2022-08-10 ENCOUNTER — Encounter (HOSPITAL_BASED_OUTPATIENT_CLINIC_OR_DEPARTMENT_OTHER): Payer: Self-pay | Admitting: Orthopaedic Surgery

## 2022-08-10 ENCOUNTER — Ambulatory Visit (INDEPENDENT_AMBULATORY_CARE_PROVIDER_SITE_OTHER): Payer: BC Managed Care – PPO

## 2022-08-10 ENCOUNTER — Ambulatory Visit (HOSPITAL_BASED_OUTPATIENT_CLINIC_OR_DEPARTMENT_OTHER): Payer: BC Managed Care – PPO | Admitting: Orthopaedic Surgery

## 2022-08-10 DIAGNOSIS — M79672 Pain in left foot: Secondary | ICD-10-CM | POA: Diagnosis not present

## 2022-08-10 NOTE — Progress Notes (Signed)
Chief Complaint: Left ankle pain     History of Present Illness:    Julie Hanson is a 57 y.o. female presents today with ongoing left ankle pain after she twisted and inverted the ankle falling off a curb.  Overall her pain is continuing to improve slowly.  There is still some tenderness about the lateral aspect of the ankle.  She has been icing and elevating.  She has not been immobilized.    Surgical History:   None  PMH/PSH/Family History/Social History/Meds/Allergies:    Past Medical History:  Diagnosis Date   Anemia    with fibroids   Blood transfusion without reported diagnosis    Depression    GERD (gastroesophageal reflux disease)    Hemorrhoids    Irregular heart beat    Past Surgical History:  Procedure Laterality Date   UTERINE FIBROID SURGERY  03/01/2012   Dr. Valentina Shaggy   Social History   Socioeconomic History   Marital status: Single    Spouse name: Not on file   Number of children: Not on file   Years of education: Not on file   Highest education level: Not on file  Occupational History   Not on file  Tobacco Use   Smoking status: Never   Smokeless tobacco: Never  Vaping Use   Vaping status: Never Used  Substance and Sexual Activity   Alcohol use: Yes    Alcohol/week: 2.0 - 3.0 standard drinks of alcohol    Types: 2 - 3 Standard drinks or equivalent per week    Comment: occ   Drug use: Never   Sexual activity: Yes    Partners: Male    Comment: 1st intercourse- 39, partners- 5+, current partner- 12 yrs   Other Topics Concern   Not on file  Social History Narrative   Not on file   Social Determinants of Health   Financial Resource Strain: Not on file  Food Insecurity: Not on file  Transportation Needs: Not on file  Physical Activity: Not on file  Stress: Not on file  Social Connections: Not on file   Family History  Problem Relation Age of Onset   Cancer Mother        bladder   Diabetes Father     Hypertension Father    Diabetes Maternal Grandmother    Cancer Maternal Grandfather        leukemia   Cancer Paternal Grandmother        throat   Diabetes Paternal Grandmother    Colon cancer Neg Hx    Esophageal cancer Neg Hx    Stomach cancer Neg Hx    Allergies  Allergen Reactions   Sulfa Antibiotics Other (See Comments)   Current Outpatient Medications  Medication Sig Dispense Refill   famotidine (PEPCID) 20 MG tablet Take 20 mg by mouth at bedtime.     loratadine (CLARITIN) 10 MG tablet Take 10 mg by mouth daily as needed for allergies.     omeprazole (PRILOSEC) 40 MG capsule TAKE 1 CAPSULE (40 MG TOTAL) BY MOUTH 2 (TWO) TIMES DAILY. TAKE FOR 10 WEEKS 180 capsule 1   No current facility-administered medications for this visit.   No results found.  Review of Systems:   A ROS was performed including pertinent positives and negatives as documented in the  HPI.  Physical Exam :   Constitutional: NAD and appears stated age Neurological: Alert and oriented Psych: Appropriate affect and cooperative Last menstrual period 03/16/2015.   Comprehensive Musculoskeletal Exam:    Tenderness to the ATFL with positive talar tilt.  No significant laxity of the left ankle with calcaneal tilt.  Otherwise full painless range of motion  Imaging:   Xray (3 views left foot): Normal  I personally reviewed and interpreted the radiographs.   Assessment:   57 y.o. female with a left ankle sprain.  Overall she has Been continuing to improve.  This time I would advise that she may be activity as tolerated and I did recommend an over-the-counter soft brace.  She will message [place] on MyChart should she not be completely improved in 1 month  Plan :    -Return to clinic as needed     I personally saw and evaluated the patient, and participated in the management and treatment plan.  Huel Cote, MD Attending Physician, Orthopedic Surgery  This document was dictated using Dragon voice  recognition software. A reasonable attempt at proof reading has been made to minimize errors.

## 2022-08-20 ENCOUNTER — Other Ambulatory Visit: Payer: BC Managed Care – PPO

## 2022-08-20 DIAGNOSIS — E7800 Pure hypercholesterolemia, unspecified: Secondary | ICD-10-CM | POA: Diagnosis not present

## 2022-08-20 DIAGNOSIS — K219 Gastro-esophageal reflux disease without esophagitis: Secondary | ICD-10-CM | POA: Diagnosis not present

## 2022-08-21 ENCOUNTER — Other Ambulatory Visit: Payer: Self-pay | Admitting: Adult Health

## 2022-08-21 DIAGNOSIS — M25611 Stiffness of right shoulder, not elsewhere classified: Secondary | ICD-10-CM | POA: Diagnosis not present

## 2022-08-21 DIAGNOSIS — M25511 Pain in right shoulder: Secondary | ICD-10-CM | POA: Diagnosis not present

## 2022-08-21 LAB — CBC WITH DIFFERENTIAL/PLATELET
Absolute Monocytes: 324 {cells}/uL (ref 200–950)
Basophils Absolute: 40 {cells}/uL (ref 0–200)
Basophils Relative: 1.1 %
Eosinophils Absolute: 61 {cells}/uL (ref 15–500)
Eosinophils Relative: 1.7 %
HCT: 41 % (ref 35.0–45.0)
Hemoglobin: 13.9 g/dL (ref 11.7–15.5)
Lymphs Abs: 1300 {cells}/uL (ref 850–3900)
MCH: 30.1 pg (ref 27.0–33.0)
MCHC: 33.9 g/dL (ref 32.0–36.0)
MCV: 88.7 fL (ref 80.0–100.0)
MPV: 10.2 fL (ref 7.5–12.5)
Monocytes Relative: 9 %
Neutro Abs: 1876 {cells}/uL (ref 1500–7800)
Neutrophils Relative %: 52.1 %
Platelets: 153 10*3/uL (ref 140–400)
RBC: 4.62 10*6/uL (ref 3.80–5.10)
RDW: 13 % (ref 11.0–15.0)
Total Lymphocyte: 36.1 %
WBC: 3.6 10*3/uL — ABNORMAL LOW (ref 3.8–10.8)

## 2022-08-21 LAB — LIPID PANEL
Cholesterol: 204 mg/dL — ABNORMAL HIGH (ref <200)
HDL: 66 mg/dL (ref >=50)
LDL Cholesterol (Calc): 115 mg/dL — ABNORMAL HIGH
Non-HDL Cholesterol (Calc): 138 mg/dL — ABNORMAL HIGH (ref <130)
Total CHOL/HDL Ratio: 3.1 (calc) (ref <5.0)
Triglycerides: 121 mg/dL (ref <150)

## 2022-08-21 MED ORDER — ROSUVASTATIN CALCIUM 10 MG PO TABS: 10.0000 mg | ORAL_TABLET | Freq: Every day | ORAL | 3 refills | Status: DC

## 2022-08-23 ENCOUNTER — Encounter: Payer: Self-pay | Admitting: Orthopedic Surgery

## 2022-08-23 ENCOUNTER — Ambulatory Visit (INDEPENDENT_AMBULATORY_CARE_PROVIDER_SITE_OTHER): Payer: BC Managed Care – PPO | Admitting: Orthopedic Surgery

## 2022-08-23 VITALS — BP 136/78 | HR 76 | Temp 97.2°F | Resp 16 | Ht 66.0 in | Wt 168.4 lb

## 2022-08-23 DIAGNOSIS — E7800 Pure hypercholesterolemia, unspecified: Secondary | ICD-10-CM

## 2022-08-23 DIAGNOSIS — D72829 Elevated white blood cell count, unspecified: Secondary | ICD-10-CM

## 2022-08-23 DIAGNOSIS — M8580 Other specified disorders of bone density and structure, unspecified site: Secondary | ICD-10-CM

## 2022-08-23 DIAGNOSIS — M25511 Pain in right shoulder: Secondary | ICD-10-CM

## 2022-08-23 DIAGNOSIS — K219 Gastro-esophageal reflux disease without esophagitis: Secondary | ICD-10-CM

## 2022-08-23 NOTE — Patient Instructions (Addendum)
Recommend getting Shingrix vaccine- may cause fatigue  Consider weight bearing exercise for bone health  Consider Caltrate supplement for bone health- take once daily   Please schedule lab visit for 6 weeks to have blood counts redrawn

## 2022-08-23 NOTE — Progress Notes (Signed)
Careteam: Patient Care Team: Julie Heir, NP as PCP - General (Adult Health Nurse Practitioner) [name], MD as Consulting Physician (Obstetrics and Gynecology)  Seen by: [name], AGNP-C  PLACE OF SERVICE:  [facility] CL[facility]  Advanced Directive information    Allergies  Allergen Reactions   Sulfa Antibiotics Other (See Comments)    Chief Complaint  Patient presents with   Medical Management of Chronic Issues    6 month follow up.   Immunizations    Discuss the need for Shingrix vaccine, Covid Booster, and Influenza vaccine.NCIR Verified.     HPI: Patient is a 57 y.o. female seen day for medical management of chronic conditions.   Colonscopy completed 04/17/2022> repeat in 7 years.   Endoscopy confirmed GERD, she is now on omeprazole once daily.   Cholesterol improved from last encounter. LDL 115> was 130. Another provider provider recommended statin, but she does to want to start.   WBC low. Family h/o leukemia.   Enrolled in PT with [facility] for right shoulder pain.   Does not plans to have flu or covid vaccines this fall.   Shingrix vaccine discussed.    Review of Systems:  Review of Systems  Constitutional: Negative.   HENT: Negative.    Eyes: Negative.   Respiratory: Negative.    Cardiovascular: Negative.   Gastrointestinal:  Positive for heartburn. Negative for abdominal pain.  Genitourinary: Negative.   Musculoskeletal:  Positive for joint pain.  Skin: Negative.   Neurological: Negative.   Psychiatric/Behavioral: Negative.      Past Medical History:  Diagnosis Date   Anemia    with fibroids   Blood transfusion without reported diagnosis    Depression    GERD (gastroesophageal reflux disease)    Hemorrhoids    Irregular heart beat    Past Surgical History:  Procedure Laterality Date   UTERINE FIBROID SURGERY  03/01/2012   Dr. Valentina Shaggy   Social History:   reports that she has never smoked. She has never used smokeless tobacco. She  reports current alcohol use of about 2.0 - 3.0 standard drinks of alcohol per week. She reports that she does not use drugs.  Family History  Problem Relation Age of Onset   Cancer Mother        bladder   Diabetes Father    Hypertension Father    Diabetes Maternal Grandmother    Cancer Maternal Grandfather        leukemia   Cancer Paternal Grandmother        throat   Diabetes Paternal Grandmother    Colon cancer Neg Hx    Esophageal cancer Neg Hx    Stomach cancer Neg Hx     Medications: Patient's Medications  New Prescriptions   No medications on file  Previous Medications   FAMOTIDINE (PEPCID) 20 MG TABLET    Take 20 mg by mouth at bedtime.   LORATADINE (CLARITIN) 10 MG TABLET    Take 10 mg by mouth daily as needed for allergies.   OMEPRAZOLE (PRILOSEC) 40 MG CAPSULE    TAKE 1 CAPSULE (40 MG TOTAL) BY MOUTH 2 (TWO) TIMES DAILY. TAKE FOR 10 WEEKS   ROSUVASTATIN (CRESTOR) 10 MG TABLET    Take 1 tablet (10 mg total) by mouth daily.  Modified Medications   No medications on file  Discontinued Medications   No medications on file    Physical Exam:  There were no vitals filed for this visit. There is no height  or weight on file to calculate BMI. Wt Readings from Last 3 Encounters:  06/12/22 167 lb 6.4 oz (75.9 kg)  04/17/22 167 lb (75.8 kg)  04/11/22 167 lb 9.6 oz (76 kg)    Physical Exam Constitutional:      General: She is not in acute distress. HENT:     Head: Normocephalic.  Eyes:     General:        Right eye: No discharge.        Left eye: No discharge.  Neck:     Thyroid: No thyroid mass.  Cardiovascular:     Rate and Rhythm: Normal rate and regular rhythm.     Pulses: Normal pulses.     Heart sounds: Normal heart sounds.  Pulmonary:     Effort: Pulmonary effort is normal. No respiratory distress.     Breath sounds: Normal breath sounds. No wheezing.  Abdominal:     Palpations: Abdomen is soft.  Musculoskeletal:     Cervical back: Neck supple.      Right lower leg: No edema.     Left lower leg: No edema.  Lymphadenopathy:     Cervical: No cervical adenopathy.  Skin:    General: Skin is warm.     Capillary Refill: Capillary refill takes less than 2 seconds.  Neurological:     General: No focal deficit present.     Mental Status: She is alert and oriented to person, place, and time.  Psychiatric:        Mood and Affect: Mood normal.     Labs reviewed: Basic Metabolic Panel: Recent Labs    02/26/22 0000  NA 142  K 4.3  CL 108  CO2 26  GLUCOSE 86  BUN 17  CREATININE 0.81  CALCIUM 9.0   Liver Function Tests: Recent Labs    02/26/22 0000  AST 15  ALT 20  BILITOT 0.4  PROT 6.6   No results for input(s): "LIPASE", "AMYLASE" in the last 8760 hours. No results for input(s): "AMMONIA" in the last 8760 hours. CBC: Recent Labs    02/26/22 0000 08/20/22 0905  WBC 3.5* 3.6*  NEUTROABS 2,065 1,876  HGB 13.9 13.9  HCT 40.9 41.0  MCV 88.1 88.7  PLT 163 153   Lipid Panel: Recent Labs    02/26/22 0000 08/20/22 0905  CHOL 207* 204*  HDL 59 66  LDLCALC 130* 115*  TRIG 85 121  CHOLHDL 3.5 3.1   TSH: No results for input(s): "TSH" in the last 8760 hours. A1C: No results found for: "HGBA1C"   Assessment/Plan 1. Pure hypercholesterolemia - improved  - total 204> was 207, LDL 115> was 130 - statin recommended by other provider - does not want statin at this time - cont diet low in processed/ fried foods - Lipid Panel; Future  2. Gastroesophageal reflux disease without esophagitis - followed by GI - 06/12/2022 endoscopy confirmed GERD - cont omeprazole - continue to limit food triggers - recommend limiting PPI use due to osteopenia  3. Leukocytosis, unspecified type - WBC 3.6 (08/19)> was 3.5 (02/26) - family h/o leukemia - repeat levels in 4-6 weeks - consider hematology referral if levels continue to trend low - CBC with Differential/Platelet; Future  4. Acute pain of right shoulder - followed  by [facility] - enrolled in PT  5. Osteopenia, unspecified location - followed by gynecology - DEXA results requested, but not sent  - discussed Caltrate supplement - discussed SE of PPI use  - cont  weight bearing exercise - Complete Metabolic Panel with eGFR; Future  Total time: 32 minutes. Greater than 50% of total time spent doing patient education regarding health maintenance, HLD, GERD and lab results including symptom/medication management.     Next appt: Visit date not found  August Gosser Scherry Ran  [facility] [phone]

## 2022-08-28 DIAGNOSIS — M25511 Pain in right shoulder: Secondary | ICD-10-CM | POA: Diagnosis not present

## 2022-08-28 DIAGNOSIS — M25611 Stiffness of right shoulder, not elsewhere classified: Secondary | ICD-10-CM | POA: Diagnosis not present

## 2022-09-04 DIAGNOSIS — M25511 Pain in right shoulder: Secondary | ICD-10-CM | POA: Diagnosis not present

## 2022-09-04 DIAGNOSIS — M25611 Stiffness of right shoulder, not elsewhere classified: Secondary | ICD-10-CM | POA: Diagnosis not present

## 2022-09-10 DIAGNOSIS — S42251D Displaced fracture of greater tuberosity of right humerus, subsequent encounter for fracture with routine healing: Secondary | ICD-10-CM | POA: Diagnosis not present

## 2022-09-11 DIAGNOSIS — M25511 Pain in right shoulder: Secondary | ICD-10-CM | POA: Diagnosis not present

## 2022-09-11 DIAGNOSIS — M25611 Stiffness of right shoulder, not elsewhere classified: Secondary | ICD-10-CM | POA: Diagnosis not present

## 2022-09-18 DIAGNOSIS — M25611 Stiffness of right shoulder, not elsewhere classified: Secondary | ICD-10-CM | POA: Diagnosis not present

## 2022-09-18 DIAGNOSIS — M25511 Pain in right shoulder: Secondary | ICD-10-CM | POA: Diagnosis not present

## 2022-09-25 DIAGNOSIS — M25611 Stiffness of right shoulder, not elsewhere classified: Secondary | ICD-10-CM | POA: Diagnosis not present

## 2022-09-25 DIAGNOSIS — M25511 Pain in right shoulder: Secondary | ICD-10-CM | POA: Diagnosis not present

## 2022-10-02 ENCOUNTER — Encounter: Payer: Self-pay | Admitting: Internal Medicine

## 2022-10-02 ENCOUNTER — Ambulatory Visit (INDEPENDENT_AMBULATORY_CARE_PROVIDER_SITE_OTHER): Payer: BC Managed Care – PPO | Admitting: Internal Medicine

## 2022-10-02 VITALS — BP 124/82 | HR 71 | Ht 66.0 in | Wt 171.0 lb

## 2022-10-02 DIAGNOSIS — Z860100 Personal history of colon polyps, unspecified: Secondary | ICD-10-CM | POA: Diagnosis not present

## 2022-10-02 DIAGNOSIS — K31A0 Gastric intestinal metaplasia, unspecified: Secondary | ICD-10-CM | POA: Diagnosis not present

## 2022-10-02 DIAGNOSIS — K219 Gastro-esophageal reflux disease without esophagitis: Secondary | ICD-10-CM

## 2022-10-02 MED ORDER — OMEPRAZOLE 40 MG PO CPDR: 40.0000 mg | DELAYED_RELEASE_CAPSULE | Freq: Every day | ORAL | 1 refills | Status: DC

## 2022-10-02 NOTE — Progress Notes (Signed)
Chief Complaint: GERD, dysphagai  HPI : 57 year old female with history of GERD, fibroids, depression, and hemorrhoids presents for follow-up of GERD and dysphagia  Interval History: She has been doing well. Denies chest burning or regurgitation. Denies dysphagia. Currently on a omeprazole 40 mg every day, which seems to keep her symptoms under good control. She is on average having 1 BM every other day. Denies ab pain. Denies blood in the stools.   Wt Readings from Last 3 Encounters:  10/02/22 171 lb (77.6 kg)  08/23/22 168 lb 6.4 oz (76.4 kg)  06/12/22 167 lb 6.4 oz (75.9 kg)   Past Medical History:  Diagnosis Date   Anemia    with fibroids   Blood transfusion without reported diagnosis    Depression    GERD (gastroesophageal reflux disease)    Hemorrhoids    Irregular heart beat    Past Surgical History:  Procedure Laterality Date   UTERINE FIBROID SURGERY  03/01/2012   Dr. Valentina Shaggy   Family History  Problem Relation Age of Onset   Cancer Mother        bladder   Diabetes Father    Hypertension Father    Diabetes Maternal Grandmother    Cancer Maternal Grandfather        leukemia   Cancer Paternal Grandmother        throat   Diabetes Paternal Grandmother    Colon cancer Neg Hx    Esophageal cancer Neg Hx    Stomach cancer Neg Hx    Social History   Tobacco Use   Smoking status: Never   Smokeless tobacco: Never  Vaping Use   Vaping status: Never Used  Substance Use Topics   Alcohol use: Yes    Alcohol/week: 2.0 - 3.0 standard drinks of alcohol    Types: 2 - 3 Standard drinks or equivalent per week    Comment: occ   Drug use: Never   Current Outpatient Medications  Medication Sig Dispense Refill   omeprazole (PRILOSEC) 40 MG capsule TAKE 1 CAPSULE (40 MG TOTAL) BY MOUTH 2 (TWO) TIMES DAILY. TAKE FOR 10 WEEKS 180 capsule 1   No current facility-administered medications for this visit.   Allergies  Allergen Reactions   Sulfa Antibiotics Other (See  Comments)   Review of Systems: All systems reviewed and negative except where noted in HPI.   Physical Exam: BP 124/82   Pulse 71   Ht 5\' 6"  (1.676 m)   Wt 171 lb (77.6 kg)   LMP 03/16/2015   BMI 27.60 kg/m  Constitutional: Pleasant,well-developed, female in no acute distress. HEENT: Normocephalic and atraumatic. Conjunctivae are normal. No scleral icterus. Cardiovascular: Normal rate, regular rhythm.  Pulmonary/chest: Effort normal and breath sounds normal. No wheezing, rales or rhonchi. Abdominal: Soft, nondistended, nontender. Bowel sounds active throughout.  Extremities: No edema Neurological: Alert and oriented to person place and time. Skin: Skin is warm and dry. No rashes noted. Psychiatric: Normal mood and affect. Behavior is normal.  Labs 07/2017: CBC with low Hb of 9.5. CMP with low albumin of 3.3.   Labs 02/2022: CMP nml. HCV Ab NR. HIV negative.  Labs 08/2022: CBC with low WBC of 3.6.  MR Pelvis w/contrast 03/05/12: IMPRESSION:  1. Diffuse involvement of the uterus by numerous fibroids, as  detailed above.  Multiple submucosal fibroids noted, but no  intracavitary or pedunculated subserosal fibroids identified.  2.  Normal ovaries.  No adnexal mass or other significant  abnormality identified.  EGD 04/17/22: - LA Grade C reflux esophagitis with no bleeding. Biopsied.  - Hiatal hernia.  - Clear gastric fluid with scattered coffee grounds.  - Four gastric polyps. Resected and retrieved.  - Erythematous mucosa in the antrum. Biopsied.  - Mucosal variant in the duodenum. Biopsied. Path: 1. Surgical [P], duodenum, mucosal changes - DUODENAL MUCOSA WITH REACTIVE CHANGES, GASTRIC CELL METAPLASIA, INFLAMMATION AND BRUNNER'S GLAND HYPERPLASIA CONSISTENT WITH CHRONIC PEPTIC DUODENITIS - NEGATIVE FOR CELIAC CHANGE OR DYSPLASIA 2. Surgical [P], gastritis - ANTRAL AND OXYNTIC-TYPE MUCOSA WITH MILD CHRONIC, INACTIVE GASTRITIS - ANTRAL MUCOSA WITH FOCAL INTESTINAL  METAPLASIA - IMMUNOHISTOCHEMICAL STAIN FOR HELICOBACTER ORGANISMS IS NEGATIVE - NEGATIVE FOR DYSPLASIA 3. Surgical [P], gastritic polyps, polyp (4) - FUNDIC GLAND POLYP(S) - NEGATIVE FOR HELICOBACTER ORGANISMS ON H&E STAIN - NEGATIVE FOR DYSPLASIA 4. Surgical [P], esophagitis - SQUAMOCOLUMNAR MUCOSA WITH MARKED, REACTIVE CHANGES AND FOCAL ACUTE AND CHRONIC INFLAMMATION CONSISTENT WITH REFLUX - NEGATIVE FOR HSV-1, HSV-2 AND FUNGAL ORGANISMS - NEGATIVE FOR INTESTINAL METAPLASIA  Colonoscopy 04/17/22: - Two 3 to 5 mm polyps in the sigmoid colon and in the ascending colon, removed with a cold snare. Resected and retrieved.  - Non- bleeding internal hemorrhoids. Path: 5. Surgical [P], colon, ascending, sigmoid, polyp (2) - TUBULAR ADENOMA (1 OF 2 FRAGMENTS) - POLYPOID COLONIC MUCOSA WITH FOCAL SURFACE HYPERPLASTIC CHANGES AND LYMPHOID AGGREGATE (1 OF 2 FRAGMENTS) - NEGATIVE FOR HIGH-GRADE DYSPLASIA OR MALIGNANCY  EGD 06/12/22: - Normal esophagus.  - Large hiatal hernia.  - Erythematous mucosa in the gastric body and antrum. Biopsied for gastric mapping.  - Multiple gastric polyps ( previously biopsied as fundic gland polyps) . - Normal examined duodenum. Path: 1. Surgical [P], gastric antrum - GASTRIC ANTRAL MUCOSA WITH MILD NONSPECIFIC REACTIVE GASTROPATHY - HELICOBACTER PYLORI-LIKE ORGANISMS ARE NOT IDENTIFIED ON ROUTINE H&E STAIN - NEGATIVE FOR INTESTINAL METAPLASIA OR DYSPLASIA 2. Surgical [P], gastric incisura - GASTRIC ANTRAL AND OXYNTIC MUCOSA WITH NO SPECIFIC HISTOPATHOLOGIC CHANGES - HELICOBACTER PYLORI-LIKE ORGANISMS ARE NOT IDENTIFIED ON ROUTINE H&E STAIN - NEGATIVE FOR INTESTINAL METAPLASIA OR DYSPLASIA 3. Surgical [P], gastric body - GASTRIC OXYNTIC MUCOSA WITH PARIETAL CELL HYPERPLASIA AS CAN BE SEEN IN HYPERGASTRINEMIC STATES SUCH AS PPI THERAPY. - HELICOBACTER PYLORI-LIKE ORGANISMS ARE NOT IDENTIFIED ON ROUTINE H&E STAIN - NEGATIVE FOR INTESTINAL METAPLASIA OR  DYSPLASIA  ASSESSMENT AND PLAN: GERD History of esophagitis Dysphagia -resolved History of gastric intestinal metaplasia History of colon polyps Constipation Hemorrhoids Patient presents for follow-up of GERD and dysphagia.  On her EGD in 04/2022 she was noted to have grade C reflux esophagitis and a hiatal hernia.  She was instructed to take PPI twice daily and had resolution in her dysphagia and esophagitis by the time of repeat EGD in 06/2022.  Currently she has been able to decrease her PPI to once daily, which has been keeping her symptoms under good control.  Thus we will maintain her on once daily dosing.  Patient was noted to have focal gastric intestinal metaplasia based on her prior EGDs with gastric mapping.  I discussed this with the patient today and she would like to continue surveillance of her gastric intestinal metaplasia.  Thus we will plan for repeat EGD in 3 years. Hemorrhoids and constipation currently seem to be under good control. - Cont omeprazole 40 mg every day. Refill  - Recall EGD for GIM surveillance in 06/2025 - Colonoscopy for polyp surveillance 04/2029  Eulah Pont, MD  I spent 30 minutes of time, including in depth chart review, independent  review of results as outlined above, communicating results with the patient directly, face-to-face time with the patient, coordinating care, ordering studies and medications as appropriate, and documentation.

## 2022-10-02 NOTE — Patient Instructions (Addendum)
We have sent the following medications to your pharmacy for you to pick up at your convenience: Omeprazole  Follow up as needed   If your blood pressure at your visit was 140/90 or greater, please contact your primary care physician to follow up on this.  _______________________________________________________  If you are age 55 or older, your body mass index should be between 23-30. Your Body mass index is 27.6 kg/m. If this is out of the aforementioned range listed, please consider follow up with your Primary Care Provider.  If you are age 85 or younger, your body mass index should be between 19-25. Your Body mass index is 27.6 kg/m. If this is out of the aformentioned range listed, please consider follow up with your Primary Care Provider.   ________________________________________________________  The [facility] providers would like to encourage you to use [facility] to communicate with providers for non-urgent requests or questions.  Due to long hold times on the telephone, sending your provider a message by [facility] may be a faster and more efficient way to get a response.  Please allow 48 business hours for a response.  Please remember that this is for non-urgent requests.  _______________________________________________________    Thank you for entrusting me with your care and for choosing [facility],  Dr. Eulah Pont

## 2022-10-08 DIAGNOSIS — M25611 Stiffness of right shoulder, not elsewhere classified: Secondary | ICD-10-CM | POA: Diagnosis not present

## 2022-10-08 DIAGNOSIS — M25511 Pain in right shoulder: Secondary | ICD-10-CM | POA: Diagnosis not present

## 2022-10-16 DIAGNOSIS — M25511 Pain in right shoulder: Secondary | ICD-10-CM | POA: Diagnosis not present

## 2022-10-16 DIAGNOSIS — M25611 Stiffness of right shoulder, not elsewhere classified: Secondary | ICD-10-CM | POA: Diagnosis not present

## 2022-10-29 DIAGNOSIS — M25611 Stiffness of right shoulder, not elsewhere classified: Secondary | ICD-10-CM | POA: Diagnosis not present

## 2022-10-29 DIAGNOSIS — M25511 Pain in right shoulder: Secondary | ICD-10-CM | POA: Diagnosis not present

## 2022-11-08 DIAGNOSIS — M25511 Pain in right shoulder: Secondary | ICD-10-CM | POA: Diagnosis not present

## 2022-11-08 DIAGNOSIS — M25611 Stiffness of right shoulder, not elsewhere classified: Secondary | ICD-10-CM | POA: Diagnosis not present

## 2022-11-22 ENCOUNTER — Encounter: Payer: Self-pay | Admitting: Orthopedic Surgery

## 2022-11-22 ENCOUNTER — Ambulatory Visit: Payer: BC Managed Care – PPO | Admitting: Orthopedic Surgery

## 2022-11-22 VITALS — BP 136/88 | HR 78 | Temp 97.4°F | Resp 16 | Ht 66.0 in | Wt 171.6 lb

## 2022-11-22 DIAGNOSIS — R000 Tachycardia, unspecified: Secondary | ICD-10-CM | POA: Diagnosis not present

## 2022-11-22 DIAGNOSIS — R030 Elevated blood-pressure reading, without diagnosis of hypertension: Secondary | ICD-10-CM | POA: Diagnosis not present

## 2022-11-22 DIAGNOSIS — F419 Anxiety disorder, unspecified: Secondary | ICD-10-CM

## 2022-11-22 DIAGNOSIS — R002 Palpitations: Secondary | ICD-10-CM

## 2022-11-22 NOTE — Patient Instructions (Signed)
Please take blood pressure 2x/day for 14 days- let me know if readings are averaging > 140/80  Follow low sodium diet  Look up meditation and deep breathing for anxiety

## 2022-11-22 NOTE — Progress Notes (Signed)
Careteam: Patient Care Team: Octavia Heir, NP as PCP - General (Adult Health Nurse Practitioner) [name], MD as Consulting Physician (Obstetrics and Gynecology)  Seen by: [name], AGNP-C  PLACE OF SERVICE:  [facility] CLIN[facility]  Advanced Directive information Does Patient Have a Medical Advance Directive?: No, Would patient like information on creating a medical advance directive?: No - Patient declined  Allergies  Allergen Reactions   Sulfa Antibiotics Other (See Comments)    Chief Complaint  Patient presents with   Acute Visit    Patient complains of possible Tachycardia.      HPI: Patient is a 57 y.o. female seen today for acute visit due to palpitations.   3 episodes of palpitations since October. One episode lasted 1.5 hours. Episodes occurred more in evening. She had anxiety with each episode. Denies chest pain or shortness of breath. Admits work has been more stressful. Discussed interventions for anxiety. She would like to hold off on medications at this time.   Initial blood pressure 140/100> rechecked 136/88. She is not on antihypertensive medication at this time.   Review of Systems:  Review of Systems  Constitutional: Negative.   HENT: Negative.    Eyes: Negative.   Respiratory:  Negative for shortness of breath.   Cardiovascular:  Positive for palpitations. Negative for chest pain and leg swelling.  Gastrointestinal: Negative.   Genitourinary: Negative.   Musculoskeletal: Negative.   Skin: Negative.   Neurological: Negative.   Psychiatric/Behavioral:  Negative for depression. The patient is nervous/anxious. The patient does not have insomnia.     Past Medical History:  Diagnosis Date   Anemia    with fibroids   Blood transfusion without reported diagnosis    Depression    GERD (gastroesophageal reflux disease)    Hemorrhoids    Irregular heart beat    Past Surgical History:  Procedure Laterality Date   UTERINE FIBROID SURGERY  03/01/2012   Dr.  Valentina Shaggy   Social History:   reports that she has never smoked. She has never used smokeless tobacco. She reports current alcohol use of about 2.0 - 3.0 standard drinks of alcohol per week. She reports that she does not use drugs.  Family History  Problem Relation Age of Onset   Cancer Mother        bladder   Diabetes Father    Hypertension Father    Diabetes Maternal Grandmother    Cancer Maternal Grandfather        leukemia   Cancer Paternal Grandmother        throat   Diabetes Paternal Grandmother    Colon cancer Neg Hx    Esophageal cancer Neg Hx    Stomach cancer Neg Hx     Medications: Patient's Medications  New Prescriptions   No medications on file  Previous Medications   OMEPRAZOLE (PRILOSEC) 40 MG CAPSULE    Take 1 capsule (40 mg total) by mouth daily.  Modified Medications   No medications on file  Discontinued Medications   No medications on file    Physical Exam:  Vitals:   11/22/22 1343  BP: (!) 140/100  Pulse: 78  Resp: 16  Temp: (!) 97.4 F (36.3 C)  SpO2: 97%  Weight: 171 lb 9.6 oz (77.8 kg)  Height: 5\' 6"  (1.676 m)   Body mass index is 27.7 kg/m. Wt Readings from Last 3 Encounters:  11/22/22 171 lb 9.6 oz (77.8 kg)  10/02/22 171 lb (77.6 kg)  08/23/22 168  lb 6.4 oz (76.4 kg)    Physical Exam Vitals reviewed.  Constitutional:      General: She is not in acute distress. HENT:     Head: Normocephalic.  Eyes:     General:        Right eye: No discharge.        Left eye: No discharge.  Cardiovascular:     Rate and Rhythm: Normal rate and regular rhythm.     Pulses: Normal pulses.     Heart sounds: Normal heart sounds.  Pulmonary:     Effort: Pulmonary effort is normal. No respiratory distress.     Breath sounds: Normal breath sounds. No wheezing or rales.  Abdominal:     Palpations: Abdomen is soft.  Musculoskeletal:     Cervical back: Neck supple.     Right lower leg: No edema.     Left lower leg: No edema.  Skin:    General:  Skin is warm.     Capillary Refill: Capillary refill takes less than 2 seconds.  Neurological:     General: No focal deficit present.     Mental Status: She is alert and oriented to person, place, and time.  Psychiatric:        Mood and Affect: Mood normal.     Labs reviewed: Basic Metabolic Panel: Recent Labs    02/26/22 0000  NA 142  K 4.3  CL 108  CO2 26  GLUCOSE 86  BUN 17  CREATININE 0.81  CALCIUM 9.0   Liver Function Tests: Recent Labs    02/26/22 0000  AST 15  ALT 20  BILITOT 0.4  PROT 6.6   No results for input(s): "LIPASE", "AMYLASE" in the last 8760 hours. No results for input(s): "AMMONIA" in the last 8760 hours. CBC: Recent Labs    02/26/22 0000 08/20/22 0905  WBC 3.5* 3.6*  NEUTROABS 2,065 1,876  HGB 13.9 13.9  HCT 40.9 41.0  MCV 88.1 88.7  PLT 163 153   Lipid Panel: Recent Labs    02/26/22 0000 08/20/22 0905  CHOL 207* 204*  HDL 59 66  LDLCALC 130* 115*  TRIG 85 121  CHOLHDL 3.5 3.1   TSH: No results for input(s): "TSH" in the last 8760 hours. A1C: No results found for: "HGBA1C"   Assessment/Plan 1. Palpitations - h/o paroxsymal tachycardia> Echo LVEF 60-65%> on propranolol in past> stopped taking past few years - 3 episodes since 10/2022 - admits to anxiety, otherwise asymptomatic - EKG 12-Lead> NSR today - suspect associated with anxiety/ stressors - consider checking TSH if symptoms continue  2. Anxiety - see above - hold off on SSRI> recommend Zoloft if no improvement - discussed deep breathing, calm music, meditation  3. Elevated blood pressure reading - initially 140/100> rechecked 136/88 - EKG> NSR today - check blood pressures BID x 14 days> if > 140/80 consider starting antihypertensive  Total time: 32 minutes. Greater than 50% of total time spent doing patient education regarding palpitations, anxiety and elevated blood pressures including symptom/medication management.     Next appt: 02/28/2023  [name],  Juel Burrow  [facility] [phone]

## 2022-11-26 DIAGNOSIS — H43812 Vitreous degeneration, left eye: Secondary | ICD-10-CM | POA: Diagnosis not present

## 2022-12-20 ENCOUNTER — Ambulatory Visit: Payer: BC Managed Care – PPO | Admitting: Orthopedic Surgery

## 2023-02-25 ENCOUNTER — Other Ambulatory Visit: Payer: BC Managed Care – PPO

## 2023-02-25 DIAGNOSIS — M8580 Other specified disorders of bone density and structure, unspecified site: Secondary | ICD-10-CM

## 2023-02-25 DIAGNOSIS — E7800 Pure hypercholesterolemia, unspecified: Secondary | ICD-10-CM

## 2023-02-25 DIAGNOSIS — D72829 Elevated white blood cell count, unspecified: Secondary | ICD-10-CM | POA: Diagnosis not present

## 2023-02-26 ENCOUNTER — Encounter: Payer: Self-pay | Admitting: Orthopedic Surgery

## 2023-02-26 LAB — CBC WITH DIFFERENTIAL/PLATELET
Absolute Lymphocytes: 1706 {cells}/uL (ref 850–3900)
Absolute Monocytes: 387 {cells}/uL (ref 200–950)
Basophils Absolute: 50 {cells}/uL (ref 0–200)
Basophils Relative: 1.1 %
Eosinophils Absolute: 59 {cells}/uL (ref 15–500)
Eosinophils Relative: 1.3 %
HCT: 42.2 % (ref 35.0–45.0)
Hemoglobin: 14 g/dL (ref 11.7–15.5)
MCH: 30.2 pg (ref 27.0–33.0)
MCHC: 33.2 g/dL (ref 32.0–36.0)
MCV: 90.9 fL (ref 80.0–100.0)
MPV: 11.1 fL (ref 7.5–12.5)
Monocytes Relative: 8.6 %
Neutro Abs: 2300 {cells}/uL (ref 1500–7800)
Neutrophils Relative %: 51.1 %
Platelets: 154 10*3/uL (ref 140–400)
RBC: 4.64 10*6/uL (ref 3.80–5.10)
RDW: 12.7 % (ref 11.0–15.0)
Total Lymphocyte: 37.9 %
WBC: 4.5 10*3/uL (ref 3.8–10.8)

## 2023-02-26 LAB — COMPLETE METABOLIC PANEL WITH GFR
AG Ratio: 2.4 (calc) (ref 1.0–2.5)
ALT: 13 U/L (ref 6–29)
AST: 14 U/L (ref 10–35)
Albumin: 4.6 g/dL (ref 3.6–5.1)
Alkaline phosphatase (APISO): 73 U/L (ref 37–153)
BUN: 19 mg/dL (ref 7–25)
CO2: 26 mmol/L (ref 20–32)
Calcium: 9.2 mg/dL (ref 8.6–10.4)
Chloride: 107 mmol/L (ref 98–110)
Creat: 0.86 mg/dL (ref 0.50–1.03)
Globulin: 1.9 g/dL (ref 1.9–3.7)
Glucose, Bld: 103 mg/dL — ABNORMAL HIGH (ref 65–99)
Potassium: 4.1 mmol/L (ref 3.5–5.3)
Sodium: 142 mmol/L (ref 135–146)
Total Bilirubin: 0.6 mg/dL (ref 0.2–1.2)
Total Protein: 6.5 g/dL (ref 6.1–8.1)
eGFR: 80 mL/min/{1.73_m2} (ref >=60)

## 2023-02-26 LAB — LIPID PANEL
Cholesterol: 175 mg/dL (ref <200)
HDL: 50 mg/dL (ref >=50)
LDL Cholesterol (Calc): 105 mg/dL — ABNORMAL HIGH
Non-HDL Cholesterol (Calc): 125 mg/dL (ref <130)
Total CHOL/HDL Ratio: 3.5 (calc) (ref <5.0)
Triglycerides: 102 mg/dL (ref <150)

## 2023-02-28 ENCOUNTER — Ambulatory Visit (INDEPENDENT_AMBULATORY_CARE_PROVIDER_SITE_OTHER): Payer: BC Managed Care – PPO | Admitting: Orthopedic Surgery

## 2023-02-28 ENCOUNTER — Encounter: Payer: Self-pay | Admitting: Orthopedic Surgery

## 2023-02-28 VITALS — BP 118/80 | HR 88 | Temp 96.6°F | Resp 16 | Ht 66.0 in | Wt 174.0 lb

## 2023-02-28 DIAGNOSIS — E7800 Pure hypercholesterolemia, unspecified: Secondary | ICD-10-CM | POA: Diagnosis not present

## 2023-02-28 DIAGNOSIS — R739 Hyperglycemia, unspecified: Secondary | ICD-10-CM | POA: Diagnosis not present

## 2023-02-28 DIAGNOSIS — K219 Gastro-esophageal reflux disease without esophagitis: Secondary | ICD-10-CM

## 2023-02-28 DIAGNOSIS — D72819 Decreased white blood cell count, unspecified: Secondary | ICD-10-CM

## 2023-02-28 DIAGNOSIS — R030 Elevated blood-pressure reading, without diagnosis of hypertension: Secondary | ICD-10-CM | POA: Diagnosis not present

## 2023-02-28 NOTE — Progress Notes (Signed)
 Careteam: Patient Care Team: Octavia Heir, NP as PCP - General (Adult Health Nurse Practitioner) [name], MD as Consulting Physician (Obstetrics and Gynecology)  Seen by: [name], AGNP-C  PLACE OF SERVICE:  [facility]  Advanced Directive information Does Patient Have a Medical Advance Directive?: No, Would patient like information on creating a medical advance directive?: No - Patient declined  Allergies  Allergen Reactions   Sulfa Antibiotics Other (See Comments)    Chief Complaint  Patient presents with   Medical Management of Chronic Issues    6 month follow up.   Immunizations    Discuss the need for Covid Booster, and Shingrix vaccine.     HPI: Patient is a 58 y.o. female seen today foe medical management of chronic conditions.   Discussed the use of AI scribe software for clinical note transcription with the patient, who gave verbal consent to proceed.  Her WBC has improved after reducing omeprazole from twice daily to once daily, following a consultation with her hematologist who noted the medication might have been affecting her white blood cell count.  Her glucose levels are slightly elevated (recent 103), which she attributes to a recent increase in carbohydrate intake, including rice and Valentine's candy. Plan to recheck A1c today.   She has made significant dietary changes, reducing meat and alcohol consumption, and avoiding sugary and ultra-processed foods. These changes have contributed to a decrease in her cholesterol levels, with her total cholesterol dropping from over 200 to 175, and her LDL levels also showing improvement over the past year improving from 130 to 105.   She reports a reduction in palpitations and has not experienced any panic attacks since her last visit. She attributes this improvement to managing her anxiety by 'leaning into' the episodes and not becoming overly anxious.  She has been monitoring her blood pressure at home,  although not as frequently as recommended. Her current blood pressure reading is 118/80 mmHg.  No chest pain, shortness of breath, digestive issues, urinary issues, or skin concerns.     Followed by gynecology, mammograms done with them. DEXA completed 2023.   Does not want future shingles or covid vaccinations at this time.   Review of Systems:  Review of Systems  Constitutional: Negative.   HENT: Negative.    Eyes: Negative.   Respiratory: Negative.    Cardiovascular: Negative.   Gastrointestinal:  Positive for heartburn.  Genitourinary: Negative.   Musculoskeletal: Negative.   Skin: Negative.   Neurological: Negative.   Psychiatric/Behavioral: Negative.      Past Medical History:  Diagnosis Date   Anemia    with fibroids   Blood transfusion without reported diagnosis    Depression    GERD (gastroesophageal reflux disease)    Hemorrhoids    Irregular heart beat    Past Surgical History:  Procedure Laterality Date   UTERINE FIBROID SURGERY  03/01/2012   Dr. Valentina Shaggy   Social History:   reports that she has never smoked. She has never used smokeless tobacco. She reports current alcohol use of about 2.0 - 3.0 standard drinks of alcohol per week. She reports that she does not use drugs.  Family History  Problem Relation Age of Onset   Cancer Mother        bladder   Diabetes Father    Hypertension Father    Diabetes Maternal Grandmother    Cancer Maternal Grandfather        leukemia   Cancer Paternal Grandmother  throat   Diabetes Paternal Grandmother    Colon cancer Neg Hx    Esophageal cancer Neg Hx    Stomach cancer Neg Hx     Medications: Patient's Medications  New Prescriptions   No medications on file  Previous Medications   OMEPRAZOLE (PRILOSEC) 40 MG CAPSULE    Take 1 capsule (40 mg total) by mouth daily.  Modified Medications   No medications on file  Discontinued Medications   No medications on file    Physical Exam:  Vitals:    02/28/23 0905  BP: 118/80  Pulse: 88  Resp: 16  Temp: (!) 96.6 F (35.9 C)  SpO2: 98%  Weight: 174 lb (78.9 kg)  Height: 5\' 6"  (1.676 m)   Body mass index is 28.08 kg/m. Wt Readings from Last 3 Encounters:  02/28/23 174 lb (78.9 kg)  11/22/22 171 lb 9.6 oz (77.8 kg)  10/02/22 171 lb (77.6 kg)    Physical Exam Vitals reviewed.  Constitutional:      General: She is not in acute distress. HENT:     Head: Normocephalic.  Eyes:     General:        Right eye: No discharge.        Left eye: No discharge.  Neck:     Thyroid: No thyroid mass or thyromegaly.     Vascular: No carotid bruit.  Cardiovascular:     Rate and Rhythm: Normal rate and regular rhythm.     Pulses: Normal pulses.     Heart sounds: Normal heart sounds.  Pulmonary:     Effort: Pulmonary effort is normal.     Breath sounds: Normal breath sounds.  Abdominal:     General: Bowel sounds are normal.     Palpations: Abdomen is soft.  Musculoskeletal:     Cervical back: Neck supple.     Right lower leg: No edema.     Left lower leg: No edema.  Skin:    General: Skin is warm.     Capillary Refill: Capillary refill takes less than 2 seconds.  Neurological:     General: No focal deficit present.     Mental Status: She is alert and oriented to person, place, and time.  Psychiatric:        Mood and Affect: Mood normal.     Labs reviewed: Basic Metabolic Panel: Recent Labs    02/25/23 0823  NA 142  K 4.1  CL 107  CO2 26  GLUCOSE 103*  BUN 19  CREATININE 0.86  CALCIUM 9.2   Liver Function Tests: Recent Labs    02/25/23 0823  AST 14  ALT 13  BILITOT 0.6  PROT 6.5   No results for input(s): "LIPASE", "AMYLASE" in the last 8760 hours. No results for input(s): "AMMONIA" in the last 8760 hours. CBC: Recent Labs    08/20/22 0905 02/25/23 0823  WBC 3.6* 4.5  NEUTROABS 1,876 2,300  HGB 13.9 14.0  HCT 41.0 42.2  MCV 88.7 90.9  PLT 153 154   Lipid Panel: Recent Labs    08/20/22 0905  02/25/23 0823  CHOL 204* 175  HDL 66 50  LDLCALC 115* 105*  TRIG 121 102  CHOLHDL 3.1 3.5   TSH: No results for input(s): "TSH" in the last 8760 hours. A1C: No results found for: "HGBA1C"   Assessment/Plan 1. Hyperglycemia (Primary) - glucose 103 - admits to eating rice and valentines candy - Hemoglobin A1c  2. Gastroesophageal reflux disease without esophagitis - followed  by GI - omeprazole reduced to once daily due to leukopenia - hgb 14.0 02/25/2023  3. Elevated blood pressure reading - controlled without medication - home pressures averaging < 140/90  4. Pure hypercholesterolemia - improved - LDL 105> was 115 - diet controlled - cont to avoid fat, processed and fried foods  5. Leukopenia, unspecified type - improved since reducing omeprazole - unable to review note from hematology  Total time: 31 minutes. Greater than 50% of total time spent doing patient education regarding leukopenia, heartburn, elevated cholesterol and hyperglycemia including symptom/medication management.     Next appt:Visit date not found  Yon Schiffman Coletta Memos, [facility]  [facility] [phone]

## 2023-03-01 ENCOUNTER — Encounter: Payer: Self-pay | Admitting: Orthopedic Surgery

## 2023-03-01 LAB — HEMOGLOBIN A1C
Hgb A1c MFr Bld: 5.1 %{Hb} (ref <5.7)
Mean Plasma Glucose: 100 mg/dL
eAG (mmol/L): 5.5 mmol/L

## 2023-06-16 ENCOUNTER — Other Ambulatory Visit: Payer: Self-pay

## 2023-06-16 ENCOUNTER — Ambulatory Visit
Admission: RE | Admit: 2023-06-16 | Discharge: 2023-06-16 | Disposition: A | Discharge status: Home / Self Care | Source: Ambulatory Visit | Attending: Internal Medicine | Admitting: Internal Medicine

## 2023-06-16 VITALS — BP 122/89 | HR 89 | Temp 98.1°F | Resp 20

## 2023-06-16 DIAGNOSIS — L509 Urticaria, unspecified: Secondary | ICD-10-CM

## 2023-06-16 DIAGNOSIS — R220 Localized swelling, mass and lump, head: Secondary | ICD-10-CM

## 2023-06-16 DIAGNOSIS — T783XXA Angioneurotic edema, initial encounter: Secondary | ICD-10-CM | POA: Diagnosis not present

## 2023-06-16 MED ORDER — ONDANSETRON HCL 4 MG/2ML IJ SOLN
4.0000 mg | Freq: Once | INTRAMUSCULAR | Status: AC
  Administered 2023-06-16: 4 mg via INTRAMUSCULAR

## 2023-06-16 MED ORDER — FAMOTIDINE 40 MG PO TABS: 40.0000 mg | ORAL_TABLET | Freq: Two times a day (BID) | ORAL | Status: DC

## 2023-06-16 MED ORDER — PREDNISONE 20 MG PO TABS: 40.0000 mg | ORAL_TABLET | Freq: Every day | ORAL | 0 refills | Status: DC

## 2023-06-16 MED ORDER — SODIUM CHLORIDE 0.9 % IV BOLUS
1000.0000 mL | Freq: Once | INTRAVENOUS | Status: AC
Start: 2023-06-16 — End: 2023-06-16
  Administered 2023-06-16: 1000 mL via INTRAVENOUS

## 2023-06-16 MED ORDER — FAMOTIDINE 40 MG PO TABS
40.0000 mg | ORAL_TABLET | Freq: Once | ORAL | Status: AC
  Administered 2023-06-16: 40 mg via ORAL

## 2023-06-16 MED ORDER — EPINEPHRINE 0.3 MG/0.3ML IJ SOAJ: 0.3000 mg | INTRAMUSCULAR | 3 refills | Status: DC | PRN

## 2023-06-16 MED ORDER — EPINEPHRINE 0.3 MG/0.3ML IJ SOAJ
0.3000 mg | Freq: Once | INTRAMUSCULAR | Status: AC
  Administered 2023-06-16: 0.3 mg via INTRAMUSCULAR

## 2023-06-16 MED ORDER — METHYLPREDNISOLONE SODIUM SUCC 125 MG IJ SOLR
125.0000 mg | Freq: Once | INTRAMUSCULAR | Status: AC
  Administered 2023-06-16: 125 mg via INTRAMUSCULAR

## 2023-06-16 NOTE — ED Provider Notes (Signed)
 [facility]    CSN: [id number] Arrival date & time: 06/16/23  0803      History   Chief Complaint Chief Complaint  Patient presents with   Allergic Reaction    Hives, swollen lips/ face - Entered by patient    HPI Julie Hanson is a 58 y.o. female.   58 year old female who presents urgent care with complaints of diffuse hives, itching and worsening facial swelling including swelling of the tongue.  She reports that her symptoms did start Friday night.  She was at a casino in [place] and did eat crabs at that time.  She has had this prior.  She has had no other significant allergens exposures.  She has no previous allergies to shellfish or any other foods.  She reports that after Friday night she started to notice some swelling on her face.  The rash did start around her groins but progressively worsened to her entire body.  She started to become concerned when her lips began to swell and the right side of her face which then progressed to the left.  She then noted tongue swelling this morning.  She denies any difficulty breathing, shortness of breath, chest pain, difficulty swallowing, nasal swelling.  She has been using Benadryl without improvement.  She has also used Allegra and hydrocortisone.   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 Carrol Clam    OB History     Gravida  1   Para  1   Term      Preterm      AB      Living  1      SAB      IAB      Ectopic      Multiple      Live Births               Home Medications    Prior to Admission medications   Medication Sig Start Date End Date Taking? Authorizing Provider  EPINEPHrine  0.3 mg/0.3 mL IJ SOAJ injection Inject 0.3 mg into the muscle as needed for anaphylaxis. 06/16/23  Yes Gerri Acre A, PA-C  predniSONE (DELTASONE) 20 MG tablet Take 2 tablets (40 mg total) by mouth daily with breakfast for 5 days. 06/16/23 06/21/23 Yes Dallie Patton A, PA-C  omeprazole  (PRILOSEC) 40 MG capsule Take 1 capsule (40 mg total) by mouth daily. 10/02/22   Daina Drum, MD    Family History Family History  Problem Relation Age of Onset   Cancer Mother        bladder   Diabetes Father    Hypertension Father    Diabetes Maternal Grandmother    Cancer Maternal Grandfather  leukemia   Cancer Paternal Grandmother        throat   Diabetes Paternal Grandmother    Colon cancer Neg Hx    Esophageal cancer Neg Hx    Stomach cancer Neg Hx     Social History Social History   Tobacco Use   Smoking status: Never   Smokeless tobacco: Never  Vaping Use   Vaping status: Never Used  Substance Use Topics   Alcohol use: Yes    Alcohol/week: 2.0 - 3.0 standard drinks of alcohol    Types: 2 - 3 Standard drinks or equivalent per week    Comment: occ   Drug use: Never     Allergies   Sulfa antibiotics   Review of Systems Review of Systems  Constitutional:  Negative for chills and fever.  HENT:  Positive for facial swelling. Negative for ear pain and sore throat.   Eyes:  Negative for pain and visual disturbance.  Respiratory:  Negative for cough and shortness of breath.   Cardiovascular:  Negative for chest pain and palpitations.   Gastrointestinal:  Negative for abdominal pain and vomiting.  Genitourinary:  Negative for dysuria and hematuria.  Musculoskeletal:  Negative for arthralgias and back pain.  Skin:  Positive for color change and rash.  Neurological:  Negative for seizures and syncope.  All other systems reviewed and are negative.    Physical Exam Triage Vital Signs ED Triage Vitals [06/16/23 0814]  Encounter Vitals Group     BP 128/87     Girls Systolic BP Percentile      Girls Diastolic BP Percentile      Boys Systolic BP Percentile      Boys Diastolic BP Percentile      Pulse Rate 87     Resp 16     Temp 98.1 F (36.7 C)     Temp src      SpO2 99 %     Weight      Height      Head Circumference      Peak Flow      Pain Score 0     Pain Loc      Pain Education      Exclude from Growth Chart    No data found.  Updated Vital Signs BP 122/89   Pulse 89   Temp 98.1 F (36.7 C)   Resp 20   LMP 03/16/2015   SpO2 98%   Visual Acuity Right Eye Distance:   Left Eye Distance:   Bilateral Distance:    Right Eye Near:   Left Eye Near:    Bilateral Near:     Physical Exam Vitals and nursing note reviewed.  Constitutional:      General: She is not in acute distress.    Appearance: She is well-developed.  HENT:     Head: Normocephalic and atraumatic.     Right Ear: Tympanic membrane normal.     Left Ear: Tympanic membrane normal.     Nose: Nose normal.     Mouth/Throat:     Mouth: Mucous membranes are moist.     Comments: Mild tongue swelling, No swelling of the uvula or pharynx  Eyes:     Conjunctiva/sclera: Conjunctivae normal.    Cardiovascular:     Rate and Rhythm: Normal rate and regular rhythm.     Heart sounds: No murmur heard. Pulmonary:     Effort: Pulmonary effort is normal. No respiratory distress.     Breath  sounds: Normal breath sounds.  Abdominal:     Palpations: Abdomen is soft.     Tenderness: There is no abdominal tenderness.   Musculoskeletal:         General: No swelling.     Cervical back: Neck supple.   Skin:    General: Skin is warm and dry.     Capillary Refill: Capillary refill takes less than 2 seconds.   Neurological:     Mental Status: She is alert.   Psychiatric:        Mood and Affect: Mood normal.      UC Treatments / Results  Labs (all labs ordered are listed, but only abnormal results are displayed) Labs Reviewed - No data to display  EKG   Radiology No results found.  Procedures Procedures (including critical care time)  Medications Ordered in UC Medications  methylPREDNISolone sodium succinate (SOLU-MEDROL) 125 mg/2 mL injection 125 mg (125 mg Intramuscular Given 06/16/23 0839)  EPINEPHrine  (EPI-PEN) injection 0.3 mg (0.3 mg Intramuscular Given 06/16/23 0839)  ondansetron (ZOFRAN) injection 4 mg (4 mg Intramuscular Given 06/16/23 0839)  sodium chloride  0.9 % bolus 1,000 mL (0 mLs Intravenous Stopped 06/16/23 1003)  famotidine (PEPCID) tablet 40 mg (40 mg Oral Given 06/16/23 0850)    Initial Impression / Assessment and Plan / UC Course  I have reviewed the triage vital signs and the nursing notes.  Pertinent labs & imaging results that were available during my care of the patient were reviewed by me and considered in my medical decision making (see chart for details).     Angioedema, initial encounter  Facial swelling  Hives   On initial presentation to the office the patient had evidence of angioedema and diffuse hives.  Her vital signs were stable with 100% oxygen saturation and she was not hypotensive.  She was not in respiratory distress she was treated immediately with epinephrine , 125 mg of Solu-Medrol, Pepcid 40 mg p.o., Zofran and IV access was obtained.  She was given a bolus of fluids as well.  She responded immediately well with significant improvement in symptoms.  Given her lack of respiratory distress and her improvement in the symptoms we opted to monitor the patient in clinic with  frequent vital sign checks.  The patient was monitored for approximately 4 hours and continued to show improvement.  Long discussion was made with the patient regarding epinephrine  and the needed use of this if symptoms recurred along with immediate transfer to the emergency department.  We discussed possible triggers for this event and the only 2 things that seem to be possible is the shellfish that she ate Friday shortly before her symptoms started even though she has never had a reaction in the past to this.  She did recently finish nitrofurantoin  for a urinary tract infection but this seems less likely.  We discussed that she will need to follow-up with her primary care doctor immediately to discuss the symptoms and to see if allergy testing will be possible to try and pinpoint what could have caused her to have this level of an allergic reaction.  We will have her do a course of steroids, prednisone 40 mg daily for 5 days.  At the time of discharge the patient's vital signs were stable, her swelling was almost resolved, she continued to have some hives, she was feeling much better and having no difficulty breathing at all.  There was no swelling in the oral cavity lungs were clear to auscultation and the  patient understood the plan completely.  Final Clinical Impressions(s) / UC Diagnoses   Final diagnoses:  Facial swelling  Hives  Angioedema, initial encounter     Discharge Instructions      Symptoms and physical exam findings are most consistent with a possible severe allergic reaction.  Given the facial swelling and tongue swelling we did administer epinephrine , Solu-Medrol, Pepcid and fluids.  We also gave Zofran for nausea.  There has been significant improvement in symptoms and we feel it is reasonable to discharge home now after almost 4 hours of observation with stable vital signs.  We will send home with a prescription for epinephrine  to use if there is recurrence of swelling or severe  allergic reaction.  Will also do a course of steroids.  If the allergic reaction occurs again give the epi pen and immediately go to the emergency room.  Recommend following up with primary care physician as soon as possible to discuss the symptoms.  At this point would avoid shellfish until further evaluation.  We have called in the following prescriptions: EpiPen  0.3 mg injection as needed for severe allergic reaction Prednisone 40 mg (2 tablets) once daily for 5 days. Take this in the morning.  This is a steroid to help with inflammation and pain.      ED Prescriptions     Medication Sig Dispense Auth. Provider   EPINEPHrine  0.3 mg/0.3 mL IJ SOAJ injection Inject 0.3 mg into the muscle as needed for anaphylaxis. 1 each Lorenzo Romberg A, PA-C   predniSONE (DELTASONE) 20 MG tablet Take 2 tablets (40 mg total) by mouth daily with breakfast for 5 days. 10 tablet Kreg Pesa, [place]      PDMP not reviewed this encounter.   Kreg Pesa, [place] 06/16/23 1415

## 2023-06-16 NOTE — ED Triage Notes (Signed)
 Pt presents to uc with hives, swollen lips and itchiness since Friday with worsening of symptoms daily. She has taken benadryl and allergia as well as hydrocortisone cream. No known exposures to any new detergents bugs etc.

## 2023-06-16 NOTE — Discharge Instructions (Addendum)
 Symptoms and physical exam findings are most consistent with a possible severe allergic reaction.  Given the facial swelling and tongue swelling we did administer epinephrine , Solu-Medrol, Pepcid and fluids.  We also gave Zofran for nausea.  There has been significant improvement in symptoms and we feel it is reasonable to discharge home now after almost 4 hours of observation with stable vital signs.  We will send home with a prescription for epinephrine  to use if there is recurrence of swelling or severe allergic reaction.  Will also do a course of steroids.  If the allergic reaction occurs again give the epi pen and immediately go to the emergency room.  Recommend following up with primary care physician as soon as possible to discuss the symptoms.  At this point would avoid shellfish until further evaluation.  We have called in the following prescriptions: EpiPen  0.3 mg injection as needed for severe allergic reaction Prednisone 40 mg (2 tablets) once daily for 5 days. Take this in the morning.  This is a steroid to help with inflammation and pain.

## 2023-06-18 ENCOUNTER — Emergency Department (HOSPITAL_BASED_OUTPATIENT_CLINIC_OR_DEPARTMENT_OTHER)
Admission: EM | Admit: 2023-06-18 | Discharge: 2023-06-18 | Disposition: A | Discharge status: Home / Self Care | Attending: Emergency Medicine | Admitting: Emergency Medicine

## 2023-06-18 ENCOUNTER — Encounter (HOSPITAL_BASED_OUTPATIENT_CLINIC_OR_DEPARTMENT_OTHER): Payer: Self-pay

## 2023-06-18 ENCOUNTER — Other Ambulatory Visit: Payer: Self-pay

## 2023-06-18 ENCOUNTER — Ambulatory Visit: Payer: Self-pay

## 2023-06-18 DIAGNOSIS — L509 Urticaria, unspecified: Secondary | ICD-10-CM | POA: Diagnosis not present

## 2023-06-18 DIAGNOSIS — T7840XA Allergy, unspecified, initial encounter: Secondary | ICD-10-CM | POA: Diagnosis not present

## 2023-06-18 MED ORDER — DIPHENHYDRAMINE HCL 50 MG/ML IJ SOLN
25.0000 mg | Freq: Once | INTRAMUSCULAR | Status: AC
  Administered 2023-06-18: 25 mg via INTRAVENOUS
  Filled 2023-06-18: qty 1

## 2023-06-18 MED ORDER — FAMOTIDINE IN NACL 20-0.9 MG/50ML-% IV SOLN
20.0000 mg | Freq: Once | INTRAVENOUS | Status: AC
  Administered 2023-06-18: 20 mg via INTRAVENOUS
  Filled 2023-06-18: qty 50

## 2023-06-18 MED ORDER — ONDANSETRON HCL 4 MG/2ML IJ SOLN
4.0000 mg | Freq: Once | INTRAMUSCULAR | Status: AC
  Administered 2023-06-18: 4 mg via INTRAVENOUS
  Filled 2023-06-18: qty 2

## 2023-06-18 MED ORDER — PREDNISONE 10 MG PO TABS: ORAL_TABLET | ORAL | 0 refills | Status: AC

## 2023-06-18 MED ORDER — EPINEPHRINE 0.3 MG/0.3ML IJ SOAJ: 0.3000 mg | INTRAMUSCULAR | 3 refills | Status: AC | PRN

## 2023-06-18 MED ORDER — EPINEPHRINE 0.3 MG/0.3ML IJ SOAJ
0.3000 mg | Freq: Once | INTRAMUSCULAR | Status: AC
  Administered 2023-06-18: 0.3 mg via INTRAMUSCULAR
  Filled 2023-06-18: qty 0.3

## 2023-06-18 MED ORDER — SODIUM CHLORIDE 0.9 % IV BOLUS
1000.0000 mL | Freq: Once | INTRAVENOUS | Status: AC
  Administered 2023-06-18: 1000 mL via INTRAVENOUS

## 2023-06-18 MED ORDER — METHYLPREDNISOLONE SODIUM SUCC 125 MG IJ SOLR
125.0000 mg | Freq: Once | INTRAMUSCULAR | Status: AC
  Administered 2023-06-18: 125 mg via INTRAVENOUS
  Filled 2023-06-18: qty 2

## 2023-06-18 NOTE — ED Triage Notes (Signed)
 Pt reports going to UC in [place] on Saturday for an allergic reaction. Pt reports being given an epi pen amongst other medications at that time. Pt reports that she was supposed to start prednisone yesterday morning, has not taken medication yet. Pt states she started having hives across body, swelling in lips/cheeks/eyes come back around 3pm yesterday. Pt states she took 2 allergy pills around 10:30pm and benadryl at midnight, symptoms continuing to worsen. No difficulty breathing/swallowing but swallowing feels weird.

## 2023-06-18 NOTE — ED Notes (Signed)
 ED Provider at bedside.

## 2023-06-18 NOTE — ED Notes (Signed)
 Pt ambulated to and from bathroom with steady gait.

## 2023-06-18 NOTE — ED Notes (Signed)
 Pt placed on heart monitor for additional monitoring d/t med administration. See emar. Pt tolerated well.

## 2023-06-18 NOTE — ED Notes (Signed)
 Pt to rm 11 from WR with steady gait. Placed on BP cuff and pulse ox. C/o worsening allergic reaction to unknown source. Lower lip and L/R eyes swollen. Pt denies any difficulty breathing/swallowing. VS WNL. EDMD at bedside for eval. See new orders.

## 2023-06-18 NOTE — Discharge Instructions (Addendum)
 You were evaluated in the Emergency Department and after careful evaluation, we did not find any emergent condition requiring admission or further testing in the hospital.  Your exam/testing today is overall reassuring.  Symptoms seem to be due to an allergic reaction.  Important that you take the prednisone at home starting tomorrow morning.  Would recommend the prescription for the taper for a more extended course.  Can continue Benadryl as needed for itching or rash.  Use the EpiPen  prescribed for severe symptoms such as throat closing or shortness of breath.  Follow-up with the allergy specialist.  Please return to the Emergency Department if you experience any worsening of your condition.   Thank you for allowing us  to be a part of your care.

## 2023-06-18 NOTE — ED Notes (Signed)
 Pt sitting up in bed, A/Ox4, RR equal and unlabored, SR on the monitor. Denies SOB, difficulty swallowing. Awaiting dispo.

## 2023-06-18 NOTE — ED Provider Notes (Signed)
  Physical Exam  BP 134/77   Pulse 85   Temp 98 F (36.7 C) (Oral)   Resp 12   Ht 5' 6 (1.676 m)   Wt 77.1 kg   LMP 03/16/2015   SpO2 97%   BMI 27.44 kg/m   Physical Exam  Procedures  Procedures  ED Course / MDM   Clinical Course as of 06/18/23 0919  Tue Jun 18, 2023  0506 Unchanged after multiple reassessments after histamines, Solu-Medrol.  Patient still very itchy all over, still has this very mild weird sensation to her throat.  Will proceed with EpiPen  administration. [MB]    Clinical Course User Index [MB] Edson Graces, MD   Medical Decision Making Risk Prescription drug management.   Received in signout.  Allergic reaction.  Feeling better after monitoring.  Appears stable for discharge home.       Mozell Arias, MD 06/18/23 [phone]

## 2023-06-18 NOTE — ED Notes (Signed)
 DC paperwork given and verbally understood.

## 2023-06-18 NOTE — ED Provider Notes (Signed)
 [facility]GENCY [facility] Emergency Department Provider Note MRN:  [id number]  Arrival date & time: 06/18/23     Chief Complaint   Allergic Reaction   History of Present Illness   Julie Hanson is a 58 y.o. year-old female with no pertinent past medical history presenting to the ED with chief complaint of allergic reaction.  Hives that started 2 days ago, was managed at a [facility] urgent care, discharged home.  Was supposed to fill prescription for steroids but did not.  Symptoms returning this evening.  Total body hives.  Throat feels a bit odd/weird.  Denies shortness of breath.  Review of Systems  A thorough review of systems was obtained and all systems are negative except as noted in the HPI and PMH.   Patient's Health History    Past Medical History:  Diagnosis Date   Anemia    with fibroids   Blood transfusion without reported diagnosis    Depression    GERD (gastroesophageal reflux disease)    Hemorrhoids    Irregular heart beat     Past Surgical History:  Procedure Laterality Date   UTERINE FIBROID SURGERY  03/01/2012   Dr. Carrol Clam    Family History  Problem Relation Age of Onset   Cancer Mother        bladder   Diabetes Father    Hypertension Father    Diabetes Maternal Grandmother    Cancer Maternal Grandfather        leukemia   Cancer Paternal Grandmother        throat   Diabetes Paternal Grandmother    Colon cancer Neg Hx    Esophageal cancer Neg Hx    Stomach cancer Neg Hx     Social History   Socioeconomic History   Marital status: Single    Spouse name: Not on file   Number of children: Not on file   Years of education: Not on file   Highest education level: Not on file  Occupational History   Not on file  Tobacco Use   Smoking status: Never   Smokeless tobacco: Never  Vaping Use   Vaping status: Never Used  Substance and Sexual Activity   Alcohol use: Yes    Alcohol/week: 2.0 - 3.0 standard drinks of alcohol     Types: 2 - 3 Standard drinks or equivalent per week    Comment: occ   Drug use: Never   Sexual activity: Yes    Partners: Male    Comment: 1st intercourse- 18, partners- 5+, current partner- 12 yrs   Other Topics Concern   Not on file  Social History Narrative   Not on file   Social Drivers of Health   Financial Resource Strain: Not on file  Food Insecurity: Not on file  Transportation Needs: Not on file  Physical Activity: Not on file  Stress: Not on file  Social Connections: Not on file  Intimate Partner Violence: Not on file     Physical Exam   Vitals:   06/18/23 0359 06/18/23 0420  BP: (!) 158/96 (!) 137/96  Pulse: 86 87  Resp: 18   SpO2: 98% 96%    CONSTITUTIONAL: Well-appearing, NAD NEURO/PSYCH:  Alert and oriented x 3, no focal deficits EYES:  eyes equal and reactive ENT/NECK:  no LAD, no JVD CARDIO: Regular rate, well-perfused, normal S1 and S2 PULM:  CTAB no wheezing or rhonchi GI/GU:  non-distended, non-tender MSK/SPINE:  No gross deformities, no edema SKIN: Diffuse hives   *  Additional and/or pertinent findings included in MDM below  Diagnostic and Interventional Summary    EKG Interpretation Date/Time:    Ventricular Rate:    PR Interval:    QRS Duration:    QT Interval:    QTC Calculation:   R Axis:      Text Interpretation:         Labs Reviewed - No data to display  No orders to display    Medications  methylPREDNISolone sodium succinate (SOLU-MEDROL) 125 mg/2 mL injection 125 mg (125 mg Intravenous Given 06/18/23 0411)  diphenhydrAMINE (BENADRYL) injection 25 mg (25 mg Intravenous Given 06/18/23 0411)  famotidine (PEPCID) IVPB 20 mg premix (0 mg Intravenous Stopped 06/18/23 0505)  sodium chloride  0.9 % bolus 1,000 mL (0 mLs Intravenous Stopped 06/18/23 0539)  EPINEPHrine  (EPI-PEN) injection 0.3 mg (0.3 mg Intramuscular Given 06/18/23 0526)  ondansetron (ZOFRAN) injection 4 mg (4 mg Intravenous Given 06/18/23 0526)     Procedures  /   Critical Care Procedures  ED Course and Medical Decision Making  Initial Impression and Ddx Patient exhibiting diffuse hives involving the torso, arms legs, face.  No acute distress, no wheezing, no obvious signs of anaphylaxis.  Providing IV antihistamines, steroids, will also consider epinephrine  if she does not demonstrate improvement.  Past medical/surgical history that increases complexity of ED encounter: None  Interpretation of Diagnostics Laboratory and/or imaging options to aid in the diagnosis/care of the patient were considered.  After careful history and physical examination, it was determined that there was no indication for diagnostics at this time.  Patient Reassessment and Ultimate Disposition/Management Clinical Course as of 06/18/23 0619  Tue Jun 18, 2023  0506 Unchanged after multiple reassessments after histamines, Solu-Medrol.  Patient still very itchy all over, still has this very mild weird sensation to her throat.  Will proceed with EpiPen  administration. [MB]    Clinical Course User Index [MB] Edson Graces, MD     6 AM update: Patient improving after EpiPen .  Will observe for rebound symptoms until 930, at which point she would be appropriate for discharge.  Signed out to oncoming provider at shift change.  Patient management required discussion with the following services or consulting groups:  None  Complexity of Problems Addressed Acute illness or injury that poses threat of life of bodily function  Additional Data Reviewed and Analyzed Further history obtained from: Further history from spouse/family member  Additional Factors Impacting ED Encounter Risk Prescriptions and Consideration of hospitalization  Merrick Abe. Harless Lien, MD [facility] Emergency Medicine [facility] [email or web address]  Final Clinical Impressions(s) / ED Diagnoses     ICD-10-CM   1. Allergic reaction, initial encounter  T78.40XA       ED Discharge Orders           Ordered    predniSONE (DELTASONE) 10 MG tablet        06/18/23 0617             Discharge Instructions Discussed with and Provided to Patient:     Discharge Instructions      You were evaluated in the Emergency Department and after careful evaluation, we did not find any emergent condition requiring admission or further testing in the hospital.  Your exam/testing today is overall reassuring.  Symptoms seem to be due to an allergic reaction.  Important that you take the prednisone at home starting tomorrow morning.  Would recommend the prescription for the taper for a more extended course.  Can continue Benadryl  as needed for itching or rash.  Use the EpiPen  prescribed for severe symptoms such as throat closing or shortness of breath.  Follow-up with the allergy specialist.  Please return to the Emergency Department if you experience any worsening of your condition.   Thank you for allowing us  to be a part of your care.       Edson Graces, MD 06/18/23 [phone]

## 2023-09-12 ENCOUNTER — Other Ambulatory Visit: Payer: Self-pay | Admitting: Adult Health

## 2023-09-12 NOTE — Telephone Encounter (Deleted)
 Pt medication was discontinued Aug 2024***

## 2023-09-12 NOTE — Telephone Encounter (Addendum)
 Medication isn't on pt med list med was discontinued in aug 2024 due to pt preference. Will confirm with patient if she is still taking medication. Contatced patient and left a detailed voicemail , patient is active on mychart and will send a message up there as well.   Update 3:08 pm Contacted patient regarding medication patient stated she accidentally refilled the medication on her [facility] app and stated she didn't need the medication because she no longer takes it.  Refused medication for patient.

## 2023-11-04 DIAGNOSIS — F339 Major depressive disorder, recurrent, unspecified: Secondary | ICD-10-CM | POA: Diagnosis not present

## 2023-11-11 DIAGNOSIS — F339 Major depressive disorder, recurrent, unspecified: Secondary | ICD-10-CM | POA: Diagnosis not present

## 2023-11-18 DIAGNOSIS — F339 Major depressive disorder, recurrent, unspecified: Secondary | ICD-10-CM | POA: Diagnosis not present

## 2023-12-16 ENCOUNTER — Other Ambulatory Visit: Payer: Self-pay | Admitting: Internal Medicine

## 2024-03-05 ENCOUNTER — Ambulatory Visit: Payer: BC Managed Care – PPO | Admitting: Orthopedic Surgery
